# Patient Record
Sex: Female | Born: 1970 | Race: White | Hispanic: No | Marital: Single | Smoking: Never smoker
Health system: Southern US, Community
[De-identification: ages and names within clinical notes are randomized; demographics above are authoritative.]

## PROBLEM LIST (undated history)

## (undated) DIAGNOSIS — J449 Chronic obstructive pulmonary disease, unspecified: Secondary | ICD-10-CM

## (undated) DIAGNOSIS — C140 Malignant neoplasm of pharynx, unspecified: Secondary | ICD-10-CM

## (undated) DIAGNOSIS — R569 Unspecified convulsions: Secondary | ICD-10-CM

## (undated) DIAGNOSIS — M533 Sacrococcygeal disorders, not elsewhere classified: Secondary | ICD-10-CM

## (undated) DIAGNOSIS — Z8051 Family history of malignant neoplasm of kidney: Secondary | ICD-10-CM

## (undated) DIAGNOSIS — G8929 Other chronic pain: Secondary | ICD-10-CM

## (undated) DIAGNOSIS — R32 Unspecified urinary incontinence: Secondary | ICD-10-CM

## (undated) DIAGNOSIS — D509 Iron deficiency anemia, unspecified: Secondary | ICD-10-CM

## (undated) DIAGNOSIS — M545 Low back pain, unspecified: Secondary | ICD-10-CM

## (undated) DIAGNOSIS — O009 Unspecified ectopic pregnancy without intrauterine pregnancy: Secondary | ICD-10-CM

## (undated) DIAGNOSIS — K649 Unspecified hemorrhoids: Secondary | ICD-10-CM

## (undated) HISTORY — PX: KNEE SURGERY: SHX244

## (undated) HISTORY — PX: ECTOPIC PREGNANCY SURGERY: SHX613

## (undated) HISTORY — PX: BACK SURGERY: SHX140

---

## 2003-07-17 ENCOUNTER — Other Ambulatory Visit: Payer: Self-pay

## 2003-09-02 ENCOUNTER — Other Ambulatory Visit: Payer: Self-pay

## 2004-03-15 ENCOUNTER — Emergency Department: Payer: Self-pay | Admitting: Emergency Medicine

## 2005-08-27 ENCOUNTER — Emergency Department: Payer: Self-pay | Admitting: Emergency Medicine

## 2006-02-01 ENCOUNTER — Emergency Department: Payer: Self-pay | Admitting: Emergency Medicine

## 2006-04-18 ENCOUNTER — Inpatient Hospital Stay: Payer: Self-pay | Admitting: Internal Medicine

## 2006-12-18 ENCOUNTER — Ambulatory Visit: Payer: Self-pay | Admitting: Gastroenterology

## 2007-03-28 ENCOUNTER — Emergency Department: Payer: Self-pay | Admitting: Emergency Medicine

## 2008-02-20 ENCOUNTER — Ambulatory Visit: Payer: Self-pay | Admitting: Internal Medicine

## 2008-04-14 ENCOUNTER — Inpatient Hospital Stay: Payer: Self-pay | Admitting: Internal Medicine

## 2008-05-25 ENCOUNTER — Ambulatory Visit: Payer: Self-pay | Admitting: Family Medicine

## 2008-07-21 ENCOUNTER — Ambulatory Visit: Payer: Self-pay | Admitting: Internal Medicine

## 2008-09-19 ENCOUNTER — Emergency Department: Payer: Self-pay | Admitting: Emergency Medicine

## 2015-05-25 ENCOUNTER — Emergency Department: Payer: Medicare Other

## 2015-05-25 ENCOUNTER — Inpatient Hospital Stay
Admission: EM | Admit: 2015-05-25 | Discharge: 2015-05-26 | DRG: 101 | Disposition: A | Discharge status: Other Acute Inpatient Hospital | Payer: Medicare Other | Attending: Internal Medicine | Admitting: Internal Medicine

## 2015-05-25 DIAGNOSIS — S4990XA Unspecified injury of shoulder and upper arm, unspecified arm, initial encounter: Secondary | ICD-10-CM | POA: Diagnosis present

## 2015-05-25 DIAGNOSIS — R569 Unspecified convulsions: Secondary | ICD-10-CM

## 2015-05-25 DIAGNOSIS — Z85819 Personal history of malignant neoplasm of unspecified site of lip, oral cavity, and pharynx: Secondary | ICD-10-CM | POA: Diagnosis not present

## 2015-05-25 DIAGNOSIS — Z85528 Personal history of other malignant neoplasm of kidney: Secondary | ICD-10-CM

## 2015-05-25 DIAGNOSIS — G894 Chronic pain syndrome: Secondary | ICD-10-CM | POA: Diagnosis present

## 2015-05-25 DIAGNOSIS — W19XXXA Unspecified fall, initial encounter: Secondary | ICD-10-CM | POA: Diagnosis present

## 2015-05-25 DIAGNOSIS — G4089 Other seizures: Secondary | ICD-10-CM | POA: Diagnosis present

## 2015-05-25 DIAGNOSIS — Z9221 Personal history of antineoplastic chemotherapy: Secondary | ICD-10-CM

## 2015-05-25 DIAGNOSIS — Z91041 Radiographic dye allergy status: Secondary | ICD-10-CM | POA: Diagnosis not present

## 2015-05-25 DIAGNOSIS — Y92009 Unspecified place in unspecified non-institutional (private) residence as the place of occurrence of the external cause: Secondary | ICD-10-CM

## 2015-05-25 DIAGNOSIS — M549 Dorsalgia, unspecified: Secondary | ICD-10-CM | POA: Diagnosis present

## 2015-05-25 DIAGNOSIS — Z888 Allergy status to other drugs, medicaments and biological substances status: Secondary | ICD-10-CM | POA: Diagnosis not present

## 2015-05-25 DIAGNOSIS — M25511 Pain in right shoulder: Secondary | ICD-10-CM

## 2015-05-25 HISTORY — DX: Family history of malignant neoplasm of kidney: Z80.51

## 2015-05-25 HISTORY — DX: Low back pain, unspecified: M54.50

## 2015-05-25 HISTORY — DX: Other chronic pain: G89.29

## 2015-05-25 HISTORY — DX: Malignant neoplasm of pharynx, unspecified: C14.0

## 2015-05-25 HISTORY — DX: Unspecified convulsions: R56.9

## 2015-05-25 HISTORY — DX: Iron deficiency anemia, unspecified: D50.9

## 2015-05-25 HISTORY — DX: Low back pain: M54.5

## 2015-05-25 HISTORY — DX: Unspecified hemorrhoids: K64.9

## 2015-05-25 HISTORY — DX: Sacrococcygeal disorders, not elsewhere classified: M53.3

## 2015-05-25 HISTORY — DX: Unspecified urinary incontinence: R32

## 2015-05-25 HISTORY — DX: Unspecified ectopic pregnancy without intrauterine pregnancy: O00.90

## 2015-05-25 LAB — URINE DRUG SCREEN, QUALITATIVE (ARMC ONLY)
AMPHETAMINES, UR SCREEN: NOT DETECTED
BENZODIAZEPINE, UR SCRN: POSITIVE — AB
Barbiturates, Ur Screen: NOT DETECTED
Cannabinoid 50 Ng, Ur ~~LOC~~: POSITIVE — AB
Cocaine Metabolite,Ur ~~LOC~~: NOT DETECTED
MDMA (ECSTASY) UR SCREEN: NOT DETECTED
Methadone Scn, Ur: NOT DETECTED
Opiate, Ur Screen: POSITIVE — AB
PHENCYCLIDINE (PCP) UR S: NOT DETECTED
TRICYCLIC, UR SCREEN: POSITIVE — AB

## 2015-05-25 LAB — BASIC METABOLIC PANEL
ANION GAP: 12 (ref 5–15)
BUN: 7 mg/dL (ref 6–20)
CALCIUM: 8.9 mg/dL (ref 8.9–10.3)
CO2: 24 mmol/L (ref 22–32)
Chloride: 101 mmol/L (ref 101–111)
Creatinine, Ser: 0.65 mg/dL (ref 0.44–1.00)
GFR calc Af Amer: >60 mL/min (ref >60)
GLUCOSE: 99 mg/dL (ref 65–99)
Potassium: 4.1 mmol/L (ref 3.5–5.1)
SODIUM: 137 mmol/L (ref 135–145)

## 2015-05-25 LAB — URINALYSIS COMPLETE WITH MICROSCOPIC (ARMC ONLY)
Bilirubin Urine: NEGATIVE
Glucose, UA: NEGATIVE mg/dL
KETONES UR: NEGATIVE mg/dL
LEUKOCYTES UA: NEGATIVE
Nitrite: POSITIVE — AB
PH: 6 (ref 5.0–8.0)
PROTEIN: NEGATIVE mg/dL
RBC / HPF: NONE SEEN RBC/hpf (ref 0–5)
Specific Gravity, Urine: 1.004 — ABNORMAL LOW (ref 1.005–1.030)

## 2015-05-25 LAB — HEPATIC FUNCTION PANEL
ALT: 10 U/L — AB (ref 14–54)
AST: 17 U/L (ref 15–41)
Albumin: 4.4 g/dL (ref 3.5–5.0)
Alkaline Phosphatase: 38 U/L (ref 38–126)
BILIRUBIN INDIRECT: 0.5 mg/dL (ref 0.3–0.9)
Bilirubin, Direct: 0.1 mg/dL (ref 0.1–0.5)
TOTAL PROTEIN: 7.2 g/dL (ref 6.5–8.1)
Total Bilirubin: 0.6 mg/dL (ref 0.3–1.2)

## 2015-05-25 LAB — GLUCOSE, CAPILLARY: Glucose-Capillary: 93 mg/dL (ref 65–99)

## 2015-05-25 LAB — CBC
HCT: 41.2 % (ref 35.0–47.0)
HEMOGLOBIN: 13.3 g/dL (ref 12.0–16.0)
MCH: 27.7 pg (ref 26.0–34.0)
MCHC: 32.2 g/dL (ref 32.0–36.0)
MCV: 86 fL (ref 80.0–100.0)
Platelets: 279 10*3/uL (ref 150–440)
RBC: 4.8 MIL/uL (ref 3.80–5.20)
RDW: 13 % (ref 11.5–14.5)
WBC: 9.2 10*3/uL (ref 3.6–11.0)

## 2015-05-25 LAB — LIPASE, BLOOD: LIPASE: 18 U/L (ref 11–51)

## 2015-05-25 MED ORDER — ACETAMINOPHEN 325 MG PO TABS: 650.0000 mg | ORAL_TABLET | Freq: Four times a day (QID) | ORAL | Status: DC | PRN

## 2015-05-25 MED ORDER — MOMETASONE FURO-FORMOTEROL FUM 100-5 MCG/ACT IN AERO
2.0000 | INHALATION_SPRAY | Freq: Two times a day (BID) | RESPIRATORY_TRACT | Status: DC
  Administered 2015-05-25: 2 via RESPIRATORY_TRACT
  Filled 2015-05-25: qty 8.8

## 2015-05-25 MED ORDER — ENOXAPARIN SODIUM 40 MG/0.4ML ~~LOC~~ SOLN: 40.0000 mg | SUBCUTANEOUS | Status: DC

## 2015-05-25 MED ORDER — SODIUM CHLORIDE 0.9 % IJ SOLN
3.0000 mL | INTRAMUSCULAR | Status: DC | PRN
  Administered 2015-05-25: 3 mL via INTRAVENOUS
  Filled 2015-05-25: qty 10

## 2015-05-25 MED ORDER — POLYETHYLENE GLYCOL 3350 17 G PO PACK: 17.0000 g | PACK | Freq: Every day | ORAL | Status: DC | PRN

## 2015-05-25 MED ORDER — SODIUM CHLORIDE 0.9 % IV SOLN: 250.0000 mL | INTRAVENOUS | Status: DC | PRN

## 2015-05-25 MED ORDER — SODIUM CHLORIDE 0.9 % IV SOLN
1000.0000 mg | Freq: Once | INTRAVENOUS | Status: AC
  Administered 2015-05-25: 1000 mg via INTRAVENOUS
  Filled 2015-05-25: qty 10

## 2015-05-25 MED ORDER — ONDANSETRON HCL 4 MG PO TABS: 4.0000 mg | ORAL_TABLET | Freq: Four times a day (QID) | ORAL | Status: DC | PRN

## 2015-05-25 MED ORDER — ONDANSETRON HCL 4 MG/2ML IJ SOLN
4.0000 mg | Freq: Four times a day (QID) | INTRAMUSCULAR | Status: DC | PRN
  Administered 2015-05-25: 4 mg via INTRAVENOUS
  Filled 2015-05-25: qty 2

## 2015-05-25 MED ORDER — MORPHINE SULFATE ER 15 MG PO TBCR
15.0000 mg | EXTENDED_RELEASE_TABLET | Freq: Three times a day (TID) | ORAL | Status: DC
  Administered 2015-05-25 – 2015-05-26 (×3): 15 mg via ORAL
  Filled 2015-05-25 (×3): qty 1

## 2015-05-25 MED ORDER — GLYCOPYRROLATE 1 MG PO TABS
1.0000 mg | ORAL_TABLET | Freq: Every evening | ORAL | Status: DC
  Administered 2015-05-25: 1 mg via ORAL
  Filled 2015-05-25 (×2): qty 1

## 2015-05-25 MED ORDER — FERROUS SULFATE 325 (65 FE) MG PO TABS
325.0000 mg | ORAL_TABLET | Freq: Three times a day (TID) | ORAL | Status: DC
  Administered 2015-05-25 – 2015-05-26 (×2): 325 mg via ORAL
  Filled 2015-05-25 (×3): qty 1

## 2015-05-25 MED ORDER — LEVETIRACETAM 750 MG PO TABS
750.0000 mg | ORAL_TABLET | Freq: Two times a day (BID) | ORAL | Status: DC
  Filled 2015-05-25 (×2): qty 1

## 2015-05-25 MED ORDER — ONDANSETRON HCL 4 MG/2ML IJ SOLN
4.0000 mg | Freq: Once | INTRAMUSCULAR | Status: AC
  Administered 2015-05-25: 4 mg via INTRAVENOUS
  Filled 2015-05-25: qty 2

## 2015-05-25 MED ORDER — SODIUM CHLORIDE 0.9 % IJ SOLN: 3.0000 mL | Freq: Two times a day (BID) | INTRAMUSCULAR | Status: DC

## 2015-05-25 MED ORDER — DARIFENACIN HYDROBROMIDE ER 7.5 MG PO TB24
15.0000 mg | ORAL_TABLET | Freq: Every day | ORAL | Status: DC
  Administered 2015-05-25: 15 mg via ORAL
  Filled 2015-05-25 (×2): qty 1

## 2015-05-25 MED ORDER — GABAPENTIN 300 MG PO CAPS
900.0000 mg | ORAL_CAPSULE | Freq: Every day | ORAL | Status: DC
  Administered 2015-05-26: 900 mg via ORAL
  Filled 2015-05-25: qty 3

## 2015-05-25 MED ORDER — ALBUTEROL SULFATE HFA 108 (90 BASE) MCG/ACT IN AERS: 2.0000 | INHALATION_SPRAY | Freq: Four times a day (QID) | RESPIRATORY_TRACT | Status: DC | PRN

## 2015-05-25 MED ORDER — OXYCODONE HCL 5 MG PO TABS
5.0000 mg | ORAL_TABLET | Freq: Three times a day (TID) | ORAL | Status: DC | PRN
  Administered 2015-05-25 – 2015-05-26 (×2): 5 mg via ORAL
  Filled 2015-05-25 (×2): qty 1

## 2015-05-25 MED ORDER — BISACODYL 10 MG RE SUPP: 10.0000 mg | Freq: Every day | RECTAL | Status: DC | PRN

## 2015-05-25 MED ORDER — ALBUTEROL SULFATE (2.5 MG/3ML) 0.083% IN NEBU: 2.5000 mg | INHALATION_SOLUTION | RESPIRATORY_TRACT | Status: DC | PRN

## 2015-05-25 MED ORDER — LORAZEPAM 2 MG/ML IJ SOLN
INTRAMUSCULAR | Status: AC
  Administered 2015-05-25: 1 mg via INTRAVENOUS
  Filled 2015-05-25: qty 1

## 2015-05-25 MED ORDER — GADOBENATE DIMEGLUMINE 529 MG/ML IV SOLN: 15.0000 mL | Freq: Once | INTRAVENOUS | Status: DC | PRN

## 2015-05-25 MED ORDER — SODIUM CHLORIDE 0.9 % IV BOLUS (SEPSIS)
1000.0000 mL | Freq: Once | INTRAVENOUS | Status: AC
  Administered 2015-05-25: 1000 mL via INTRAVENOUS

## 2015-05-25 MED ORDER — CYCLOBENZAPRINE HCL 10 MG PO TABS: 10.0000 mg | ORAL_TABLET | Freq: Three times a day (TID) | ORAL | Status: DC | PRN

## 2015-05-25 MED ORDER — DOCUSATE SODIUM 100 MG PO CAPS
100.0000 mg | ORAL_CAPSULE | Freq: Two times a day (BID) | ORAL | Status: DC
  Administered 2015-05-25: 100 mg via ORAL
  Filled 2015-05-25 (×2): qty 1

## 2015-05-25 MED ORDER — GABAPENTIN 600 MG PO TABS
900.0000 mg | ORAL_TABLET | Freq: Every morning | ORAL | Status: DC
  Filled 2015-05-25: qty 2

## 2015-05-25 MED ORDER — LORAZEPAM 2 MG/ML IJ SOLN
1.0000 mg | Freq: Once | INTRAMUSCULAR | Status: AC
  Administered 2015-05-25: 1 mg via INTRAVENOUS
  Filled 2015-05-25: qty 1

## 2015-05-25 MED ORDER — ACETAMINOPHEN 650 MG RE SUPP: 650.0000 mg | Freq: Four times a day (QID) | RECTAL | Status: DC | PRN

## 2015-05-25 MED ORDER — GABAPENTIN 300 MG PO CAPS
1200.0000 mg | ORAL_CAPSULE | Freq: Every day | ORAL | Status: DC
  Administered 2015-05-25: 1200 mg via ORAL
  Filled 2015-05-25: qty 3

## 2015-05-25 MED ORDER — ALBUTEROL SULFATE (2.5 MG/3ML) 0.083% IN NEBU: 2.5000 mg | INHALATION_SOLUTION | Freq: Four times a day (QID) | RESPIRATORY_TRACT | Status: DC | PRN

## 2015-05-25 MED ORDER — MORPHINE SULFATE (PF) 4 MG/ML IV SOLN
4.0000 mg | Freq: Once | INTRAVENOUS | Status: AC
  Administered 2015-05-25: 4 mg via INTRAVENOUS
  Filled 2015-05-25: qty 1

## 2015-05-25 MED ORDER — LORAZEPAM 2 MG/ML IJ SOLN
1.0000 mg | Freq: Once | INTRAMUSCULAR | Status: AC
  Administered 2015-05-25: 1 mg via INTRAVENOUS

## 2015-05-25 MED ORDER — NORTRIPTYLINE HCL 25 MG PO CAPS
75.0000 mg | ORAL_CAPSULE | Freq: Every day | ORAL | Status: DC
  Administered 2015-05-25: 75 mg via ORAL
  Filled 2015-05-25 (×2): qty 3

## 2015-05-25 MED ORDER — SODIUM CHLORIDE 0.9 % IV SOLN
INTRAVENOUS | Status: DC
  Administered 2015-05-25 – 2015-05-26 (×2): via INTRAVENOUS

## 2015-05-25 MED ORDER — MONTELUKAST SODIUM 10 MG PO TABS
10.0000 mg | ORAL_TABLET | Freq: Every day | ORAL | Status: DC
  Administered 2015-05-25: 10 mg via ORAL
  Filled 2015-05-25: qty 1

## 2015-05-25 MED ORDER — TIOTROPIUM BROMIDE MONOHYDRATE 18 MCG IN CAPS
18.0000 ug | ORAL_CAPSULE | Freq: Every day | RESPIRATORY_TRACT | Status: DC
  Filled 2015-05-25: qty 5

## 2015-05-25 MED ORDER — DIPHENOXYLATE-ATROPINE 2.5-0.025 MG PO TABS: 1.0000 | ORAL_TABLET | Freq: Four times a day (QID) | ORAL | Status: DC | PRN

## 2015-05-25 MED ORDER — MORPHINE SULFATE (PF) 2 MG/ML IV SOLN
2.0000 mg | INTRAVENOUS | Status: DC | PRN
  Administered 2015-05-26 (×3): 2 mg via INTRAVENOUS
  Filled 2015-05-25 (×3): qty 1

## 2015-05-25 MED ORDER — LORAZEPAM 2 MG/ML IJ SOLN
2.0000 mg | INTRAMUSCULAR | Status: DC | PRN
  Administered 2015-05-26 (×2): 2 mg via INTRAVENOUS
  Administered 2015-05-26 (×2): 1 mg via INTRAVENOUS
  Filled 2015-05-25 (×4): qty 1

## 2015-05-25 MED ORDER — MIRTAZAPINE 15 MG PO TABS
15.0000 mg | ORAL_TABLET | Freq: Every evening | ORAL | Status: DC | PRN
  Filled 2015-05-25: qty 1

## 2015-05-25 NOTE — ED Notes (Signed)
MD at bedside. 

## 2015-05-25 NOTE — ED Notes (Signed)
Pt comes back from MRI due to seizing on the stretcher at CT . Pt tearful, alert but confused at this time. Dr. Edd Fabian at bedside

## 2015-05-25 NOTE — ED Notes (Addendum)
Manual radial pulse found at 100 by 2 RNs. MD made aware

## 2015-05-25 NOTE — ED Provider Notes (Signed)
[facility] Emergency Department Provider Note  ____________________________________________  Time seen: On EMS arrival  I have reviewed the triage vital signs and the nursing notes.   HISTORY  Chief Complaint Seizures and Shoulder Injury    HPI Jill Santiago is a 44 y.o. female with history of kidney cancer and "a tumor in my neck" not currently under any active treatment or followed by an oncologist "because they are trying to find me another one", seizures presents for evaluation of 2 seizures today, sudden onset, intermittent, now resolved. Patient reports that she was washing dishes when she had a generalized tonic-clonic seizure that was witnessed by her husband. She called 911 because she was having terrible right shoulder pain after the events. In route, paramedics report that she had another generalized tonic-clonic seizure. She received Versed for this. Patient reports that she was incontinent of urineduring the most recent seizure. She has some mild right-sided chest pain since the fall that is worse with palpation and movement of the right shoulder. She has chronic issues with nausea and vomiting but no fevers, no shortness of breath. She denies any pain or burning with urination. She reports that she usually has seizures once a month or once every other month. She has not had a seizure in 2 months.   Past Medical History  Diagnosis Date  . Seizures ([place])   . FH: kidney cancer   . Throat cancer ([place])     There are no active problems to display for this patient.   History reviewed. No pertinent past surgical history.  No current outpatient prescriptions on file.  Allergies Azithromycin and Contrast media  No family history on file.  Social History Social History  Substance Use Topics  . Smoking status: Never Smoker   . Smokeless tobacco: None  . Alcohol Use: No    Review of Systems Constitutional: No fever/chills Eyes: No visual  changes. ENT: No sore throat. Cardiovascular: + right chest pain. Respiratory: Denies shortness of breath. Gastrointestinal: No abdominal pain.  No nausea, no vomiting.  No diarrhea.  No constipation. Genitourinary: Negative for dysuria. Musculoskeletal: Negative for back pain. Skin: Negative for rash. Neurological: Negative for headaches, focal weakness or numbness.  10-point ROS otherwise negative.  ____________________________________________   PHYSICAL EXAM:  VITAL SIGNS: ED Triage Vitals  Enc Vitals Group     BP 05/25/15 1435 135/107 mmHg     Pulse Rate 05/25/15 1435 107     Resp 05/25/15 1435 12     Temp 05/25/15 1435 98.3 F (36.8 C)     Temp Source 05/25/15 1435 Oral     SpO2 05/25/15 1435 100 %     Weight 05/25/15 1435 130 lb (58.968 kg)     Height 05/25/15 1435 5' 5"  (1.651 m)     Head Cir --      Peak Flow --      Pain Score 05/25/15 1436 6     Pain Loc --      Pain Edu? --      Excl. in [place]? --     Constitutional: Alert and oriented. Nontoxic appearing and in no acute distress. Eyes: Conjunctivae are normal. PERRL. EOMI. Head: Atraumatic. Nose: No congestion/rhinnorhea. Mouth/Throat: Mucous membranes are moist.  Oropharynx non-erythematous. Neck: No stridor.  No midline c-spine tenderness to palpation. Cardiovascular: mildly tachycardic rate, regular rhythm. Grossly normal heart sounds.  Good peripheral circulation. Respiratory: Normal respiratory effort.  No retractions. Lungs CTAB. Gastrointestinal: Soft and nontender. No distention. No  CVA tenderness. Genitourinary: deferred Musculoskeletal: No lower extremity tenderness nor edema.  No joint effusions. There is a palpation throughout the right shoulder but the patient does have full though painful range of motion. 2+ right radial pulse, radial median and ulnar nerve intact on the right. Mild tenderness to palpation associated with the right clavicle and the ribs just under the right axilla. Neurologic:   Normal speech and language. 4+ out of 5 strength in the right upper extremity, 4+ out of 5 strength in the right lower extremity. 5 out of 5 strength in the left upper and left lower extremity. Sensations lightly decreased in the right lower extremity and the right upper extremity but otherwise intact to light touch. Skin:  Skin is warm, dry and intact. No rash noted. Psychiatric: Mood and affect are normal. Speech and behavior are normal.  ____________________________________________   LABS (all labs ordered are listed, but only abnormal results are displayed)  Labs Reviewed  HEPATIC FUNCTION PANEL - Abnormal; Notable for the following:    ALT 10 (*)    All other components within normal limits  GLUCOSE, CAPILLARY  BASIC METABOLIC PANEL  CBC  LIPASE, BLOOD  URINALYSIS COMPLETEWITH MICROSCOPIC ([facility] ONLY)  CBG MONITORING, ED   ____________________________________________  EKG  ED ECG REPORT I, Joanne Gavel, the attending physician, personally viewed and interpreted this ECG.   Date: 05/25/2015  EKG Time: 14:43  Rate: 96  Rhythm: sinus tachycardia  Axis: normal  Intervals:none  ST&T Change: No acute ST elevation.  ____________________________________________  RADIOLOGY  CXR IMPRESSION: Mild chronic bronchitic changes without infiltrate or evidence of acute injury.   Right shoulder xray IMPRESSION: No displaced fracture or subluxation. High riding humeral head suspicious for rotator cuff injury  Ct head IMPRESSION: No acute intracranial abnormalities.  If patient has persistent or recurrent seizures, recommend followup MR imaging of the brain for further assessment.   MRI C-spine without contrast IMPRESSION: 1. Multilevel spondylosis of the cervical spine is worse right than left. 2. Mild right central and foraminal stenosis at C3-4. 3. Moderate right and mild left foraminal narrowing at C4-5 and C5-6. 4. Mild right foraminal narrowing at C6-7.  MRI  brain without contrast IMPRESSION: Negative MRI of the brain.  ____________________________________________   PROCEDURES  Procedure(s) performed: None  Critical Care performed: No  ____________________________________________   INITIAL IMPRESSION / ASSESSMENT AND PLAN / ED COURSE  Pertinent labs & imaging results that were available during my care of the patient were reviewed by me and considered in my medical decision making (see chart for details).  Jill Santiago is a 44 y.o. female with history of kidney cancer and "a tumor in my neck" not currently under any active treatment or followed by an oncologist "because they are trying to findme another one", seizures presents for evaluation of 2 seizures today. On arrival to the emergency department she is awake, alert, oriented 4. She has some weakness in the right arm and right leg which may be post ictal/Todd's paralysis or the exam in the right upper extremity may be limited to pain due to the pain in her shoulder. she is neurovascularly intact in the right arm and has range of motion at the right shoulder and I doubt acute fracture or dislocation but will obtain x-ray. Plan for CT head, chest x-ray, screening labs, we'll observe in the emergency department. She will likely require advanced imaging including MRI of the brain and C-spine given history of malignancy.  ----------------------------------------- 6:16 PM on 05/25/2015 -----------------------------------------  Patient was brought back from MRI prematurely due to generalized tonic-clonic activity that was noted by the MRI tech while she was in the scanner - contrasted exam unable to be performed. On arrival back to room 14 in the ER, she is mildly confused but able to follow commands and moves all extremities. On chart review via care everywhere, she does not appear to carry any active cancer diagnosis [facility] charts reveiwed and my suspicion for an intracerebral  malignancy/metastasis) is lower at this time. Additionally, she is only prescribed gabapentin for her seizures - a medication typically use for treatment of pain and not as an antiepileptic. I discussed the case with Dr. Irish Elders, on-call for neurology who is in agreement with admission for EEG, monitoring, will give Keppra. Case discussed with the hospitalist, Dr. Darvin Neighbours, for admission at this time. ____________________________________________   FINAL CLINICAL IMPRESSION(S) / ED DIAGNOSES  Final diagnoses:  Seizure ([place])  Acute shoulder pain, right      Joanne Gavel, MD 05/25/15 2146

## 2015-05-25 NOTE — ED Notes (Signed)
Lab called for add on of lipase and hepatic funct

## 2015-05-25 NOTE — H&P (Signed)
[place] [facility] [place] NAME: Jill Santiago    MR#:  [id number]  DATE OF BIRTH:  1970/09/24  DATE OF ADMISSION:  05/25/2015  PRIMARY CARE PHYSICIAN: [place]   REQUESTING/REFERRING PHYSICIAN: Dr. Edd Fabian  CHIEF COMPLAINT:   Chief Complaint  Patient presents with  . Seizures  . Shoulder Injury    HISTORY OF PRESENT ILLNESS:  Jill Santiago  is a 44 y.o. female with a known history of chronic back pain, anxiety, seizures on Neurontin presents to the emergency room with 3 episodes of seizures. Patient also had an episode of seizure while finishing her MRI of the brain. She has long history of seizures and was on phenobarbital in the past with well-controlled seizures and was stopped 5 years back. Since then she has been on Neurontin for both chronic pain and seizures and has one episode of generalized tonic-clonic seizure every 6 months. Her last seizure was 3 months prior. Today she had 4 episodes of generalized tonic-clonic seizures with postictal state and incontinence. Patient has been extremely anxious and tearful. Her only complaint at this point is new pain and right shoulder x-ray showing possible rotator cuff tear.  Patient mentions that she has kidney cancer for which she took chemotherapy and stopped it 1 year back. This was at [facility]. But on reviewing her records couldn't find any oncology visits.  PAST MEDICAL HISTORY:   Past Medical History  Diagnosis Date  . Seizures ([place])   . FH: kidney cancer   . Throat cancer ([place])   . Iron deficiency anemia   . Hemorrhoids   . Chronic low back pain   . Urinary incontinence   . Sacroiliac joint dysfunction   . Ectopic pregnancy     PAST SURGICAL HISTORY:  History reviewed. No pertinent past surgical history.  SOCIAL HISTORY:   Social History  Substance Use Topics  . Smoking status: Never Smoker   . Smokeless tobacco: Not on file  . Alcohol Use: No    FAMILY  HISTORY:   No history of seizures DRUG ALLERGIES:   Allergies  Allergen Reactions  . Azithromycin   . Contrast Media [Iodinated Diagnostic Agents]     REVIEW OF SYSTEMS:   Review of Systems  Constitutional: Positive for malaise/fatigue. Negative for fever, chills and weight loss.  HENT: Negative for hearing loss and nosebleeds.   Eyes: Negative for blurred vision, double vision and pain.  Respiratory: Negative for cough, hemoptysis, sputum production, shortness of breath and wheezing.   Cardiovascular: Negative for chest pain, palpitations, orthopnea and leg swelling.  Gastrointestinal: Positive for nausea. Negative for vomiting, abdominal pain, diarrhea and constipation.  Genitourinary: Positive for urgency. Negative for dysuria and hematuria.  Musculoskeletal: Positive for back pain. Negative for myalgias and falls.  Skin: Negative for rash.  Neurological: Positive for tremors and seizures. Negative for dizziness, sensory change, speech change, focal weakness and headaches.  Endo/Heme/Allergies: Does not bruise/bleed easily.  Psychiatric/Behavioral: Positive for depression. Negative for memory loss. The patient is nervous/anxious.     MEDICATIONS AT HOME:   Prior to Admission medications   Not on File      VITAL SIGNS:  Blood pressure 132/90, pulse 105, temperature 98.3 F (36.8 C), temperature source Oral, resp. rate 11, height 5' 5"  (1.651 m), weight 58.968 kg (130 lb), SpO2 100 %.  PHYSICAL EXAMINATION:  Physical Exam  GENERAL:  44 y.o.-year-old patient lying in the bed with no acute distress. Tearful and anxious  EYES: Pupils equal, round, reactive to light and accommodation. No scleral icterus. Extraocular muscles intact.  HEENT: Head atraumatic, normocephalic. Oropharynx and nasopharynx clear. No oropharyngeal erythema, moist oral mucosa  NECK:  Supple, no jugular venous distention. No thyroid enlargement, no tenderness.  LUNGS: Normal breath sounds bilaterally,  no wheezing, rales, rhonchi. No use of accessory muscles of respiration.  CARDIOVASCULAR: S1, S2 normal. No murmurs, rubs, or gallops.  ABDOMEN: Soft, nontender, nondistended. Bowel sounds present. No organomegaly or mass.  EXTREMITIES: No pedal edema, cyanosis, or clubbing. + 2 pedal & radial pulses b/l.  Tenderness right shoulder NEUROLOGIC: Cranial nerves II through XII are intact. No focal Motor or sensory deficits appreciated b/l. Limited range of motion at right shoulder due to injury. Right arm tremor PSYCHIATRIC: The patient is alert and oriented x 3. Anxious SKIN: No obvious rash, lesion, or ulcer.   LABORATORY PANEL:   CBC  Recent Labs Lab 05/25/15 1439  WBC 9.2  HGB 13.3  HCT 41.2  PLT 279   ------------------------------------------------------------------------------------------------------------------  Chemistries   Recent Labs Lab 05/25/15 1439  NA 137  K 4.1  CL 101  CO2 24  GLUCOSE 99  BUN 7  CREATININE 0.65  CALCIUM 8.9  AST 17  ALT 10*  ALKPHOS 38  BILITOT 0.6   ------------------------------------------------------------------------------------------------------------------  Cardiac Enzymes No results for input(s): TROPONINI in the last 168 hours. ------------------------------------------------------------------------------------------------------------------  RADIOLOGY:  Dg Chest 2 View  05/25/2015  CLINICAL DATA:  Seizure at home, fell onto RIGHT shoulder, RIGHT shoulder pain EXAM: CHEST  2 VIEW COMPARISON:  07/21/2008 FINDINGS: Normal heart size, mediastinal contours, and pulmonary vascularity. Mild chronic bronchitic changes. Lungs otherwise clear. No pleural effusion or pneumothorax. No acute bony abnormalities. IMPRESSION: Mild chronic bronchitic changes without infiltrate or evidence of acute injury. Electronically Signed   By: Lavonia Dana M.D.   On: 05/25/2015 15:44   Dg Shoulder Right  05/25/2015  CLINICAL DATA:  Seizure, right  shoulder pain EXAM: RIGHT SHOULDER - 2+ VIEW COMPARISON:  None. FINDINGS: Three views of the right shoulder submitted. No acute fracture or subluxation. There is high riding humeral head suspicious for rotator cuff injury. IMPRESSION: No displaced fracture or subluxation. High riding humeral head suspicious for rotator cuff injury Electronically Signed   By: Lahoma Crocker M.D.   On: 05/25/2015 15:41   Ct Head Wo Contrast  05/25/2015  CLINICAL DATA:  Seizure at home, RIGHT shoulder pain/injury, history of seizure disorder, throat cancer EXAM: CT HEAD WITHOUT CONTRAST TECHNIQUE: Contiguous axial images were obtained from the base of the skull through the vertex without intravenous contrast. COMPARISON:  03/28/2007 CT orbits FINDINGS: Normal ventricular morphology. No midline shift or mass effect. Normal appearance of brain parenchyma. No intracranial hemorrhage, mass lesion, or acute infarction. Minimal scattered mucosal thickening of the ethmoid air cells. Visualized paranasal sinuses and mastoid air cells otherwise clear. Bones unremarkable. IMPRESSION: No acute intracranial abnormalities. If patient has persistent or recurrent seizures, recommend followup MR imaging of the brain for further assessment. Electronically Signed   By: Lavonia Dana M.D.   On: 05/25/2015 15:31   Mr Brain Wo Contrast  05/25/2015  CLINICAL DATA:  Seizure at home and in the ambulance today. Initial encounter. EXAM: MRI HEAD WITHOUT CONTRAST TECHNIQUE: Multiplanar, multiecho pulse sequences of the brain and surrounding structures were obtained without intravenous contrast. COMPARISON:  CT head without contrast 05/25/2015 FINDINGS: No acute infarct, hemorrhage, or mass lesion is present. The ventricles are of normal size. No significant extraaxial fluid collection  is present. Dedicated imaging of the temporal lobes demonstrate symmetric size and signal of the hippocampal structures. The internal auditory canals are within normal limits  bilaterally. Flow is present in the major intracranial arteries. The globes and orbits are intact. Mild mucosal thickening is scattered throughout the ethmoid air cells. The remaining paranasal sinuses are clear. Skullbase is within normal limits. Midline structures are unremarkable. IMPRESSION: Negative MRI of the brain. Electronically Signed   By: San Morelle M.D.   On: 05/25/2015 17:28   Mr Cervical Spine Wo Contrast  05/25/2015  CLINICAL DATA:  Seizure today. Right shoulder pain and injury. Right hand tremors. EXAM: MRI CERVICAL SPINE WITHOUT CONTRAST TECHNIQUE: Multiplanar, multisequence MR imaging of the cervical spine was performed. No intravenous contrast was administered. COMPARISON:  Cervical spine CT 09/19/2008 FINDINGS: Normal signal is present in the cervical and upper thoracic spinal cord to the lowest imaged level, T2-3. Marrow signal, vertebral body heights, and alignment are normal. The craniocervical junction is within normal limits. The visualized intracranial contents are normal. C2-3:  Negative. C3-4: A rightward disc osteophyte complex partially effaces the ventral CSF. Uncovertebral spurring contributes to mild right foraminal narrowing. C4-5: A broad-based disc osteophyte complex is present. Uncovertebral and facet disease is worse on the right. This contributes to moderate right and mild left foraminal narrowing. C5-6: A broad-based disc osteophyte complex is present. Facet hypertrophy is worse on the right. Facet hypertrophy and uncovertebral spurring is worse on the right. Moderate right and mild left foraminal stenosis is present. C6-7: Asymmetric rightward uncovertebral spurring contributes to mild right foraminal stenosis. C7-T1:  Negative. IMPRESSION: 1. Multilevel spondylosis of the cervical spine is worse right than left. 2. Mild right central and foraminal stenosis at C3-4. 3. Moderate right and mild left foraminal narrowing at C4-5 and C5-6. 4. Mild right foraminal  narrowing at C6-7. Electronically Signed   By: San Morelle M.D.   On: 05/25/2015 17:33     IMPRESSION AND PLAN:   * Seizures, recurrent History of generalized tonic-clonic seizures. Patient has been overall well controlled on Neurontin. Although she has had seizure episodes every 6 months as per the patient. No recent change in medications. MRI of the brain without contrast was normal today. Keppra IV 1 dose given. Will start oral from tomorrow morning. Add Ativan when necessary. For seizures. Seizure precautions. Consult neurology.  * Rotator cuff injury X-ray shows acute rotator cuff injury. Likely due to seizure. We will consult orthopedics.  * Chronic pain syndrome Continue home pain medications and add IV when necessary medications.  * DVT prophylaxis Lovenox   All the records are reviewed and case discussed with ED provider. Management plans discussed with the patient, family and they are in agreement.  CODE STATUS: FULL  TOTAL TIME TAKING CARE OF THIS PATIENT: 45 minutes.    Hillary Bow R M.D on 05/25/2015 at 6:28 PM  Between 7am to 6pm - Pager - [phone]  After 6pm go to [email or web address] - password [email or web address] [facility]  Office  [phone]  CC: Primary care physician; [place] [facility]     Note: This dictation was prepared with Dragon dictation along with smaller phrase technology. Any transcriptional errors that result from this process are unintentional.

## 2015-05-25 NOTE — ED Notes (Signed)
Patient transported to CT 

## 2015-05-25 NOTE — ED Notes (Signed)
meds held due to pt transported to CT

## 2015-05-25 NOTE — ED Notes (Signed)
Patient transported to MRI 

## 2015-05-25 NOTE — ED Notes (Signed)
Lab called for add on of urine drug

## 2015-05-25 NOTE — ED Notes (Signed)
Pt c/o loss of bladder. Pt cleaned and wants to try bed pan for urine
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
## 2015-05-25 NOTE — ED Notes (Signed)
Sprite and crackers given at this time

## 2015-05-25 NOTE — ED Notes (Signed)
Pt arrives via [facility] with initial cc of right shoulder pain/injury after seizure at home. Pt en route with EMS to hospital, pt began to have another seizure. Pt given 82m versed en route IV with EMS. Pt not actively seizing on arrival. Pt alert and oriented X4. Pt has tremor to right hand, states that it is baseline. Pt hx of seizure, takes Neurontin. Pt did not have aura with either seizure.

## 2015-05-26 ENCOUNTER — Inpatient Hospital Stay (HOSPITAL_COMMUNITY): Payer: Medicare Other

## 2015-05-26 ENCOUNTER — Inpatient Hospital Stay (HOSPITAL_COMMUNITY)
Admission: AD | Admit: 2015-05-26 | Discharge: 2015-06-01 | DRG: 101 | Disposition: A | Discharge status: Skilled Nursing Facility | Payer: Medicare Other | Source: Other Acute Inpatient Hospital | Attending: Internal Medicine | Admitting: Internal Medicine

## 2015-05-26 ENCOUNTER — Encounter (HOSPITAL_COMMUNITY): Payer: Self-pay | Admitting: Internal Medicine

## 2015-05-26 DIAGNOSIS — T402X5A Adverse effect of other opioids, initial encounter: Secondary | ICD-10-CM | POA: Diagnosis not present

## 2015-05-26 DIAGNOSIS — Z85819 Personal history of malignant neoplasm of unspecified site of lip, oral cavity, and pharynx: Secondary | ICD-10-CM

## 2015-05-26 DIAGNOSIS — Z7951 Long term (current) use of inhaled steroids: Secondary | ICD-10-CM | POA: Diagnosis not present

## 2015-05-26 DIAGNOSIS — F4310 Post-traumatic stress disorder, unspecified: Secondary | ICD-10-CM | POA: Diagnosis present

## 2015-05-26 DIAGNOSIS — W19XXXA Unspecified fall, initial encounter: Secondary | ICD-10-CM

## 2015-05-26 DIAGNOSIS — N390 Urinary tract infection, site not specified: Secondary | ICD-10-CM | POA: Diagnosis present

## 2015-05-26 DIAGNOSIS — X58XXXA Exposure to other specified factors, initial encounter: Secondary | ICD-10-CM | POA: Diagnosis present

## 2015-05-26 DIAGNOSIS — J449 Chronic obstructive pulmonary disease, unspecified: Secondary | ICD-10-CM | POA: Diagnosis present

## 2015-05-26 DIAGNOSIS — M25551 Pain in right hip: Secondary | ICD-10-CM

## 2015-05-26 DIAGNOSIS — G894 Chronic pain syndrome: Secondary | ICD-10-CM | POA: Diagnosis present

## 2015-05-26 DIAGNOSIS — F54 Psychological and behavioral factors associated with disorders or diseases classified elsewhere: Secondary | ICD-10-CM | POA: Diagnosis present

## 2015-05-26 DIAGNOSIS — G4089 Other seizures: Principal | ICD-10-CM | POA: Diagnosis present

## 2015-05-26 DIAGNOSIS — R569 Unspecified convulsions: Secondary | ICD-10-CM

## 2015-05-26 DIAGNOSIS — R338 Other retention of urine: Secondary | ICD-10-CM | POA: Diagnosis not present

## 2015-05-26 DIAGNOSIS — R339 Retention of urine, unspecified: Secondary | ICD-10-CM | POA: Diagnosis present

## 2015-05-26 DIAGNOSIS — K5903 Drug induced constipation: Secondary | ICD-10-CM | POA: Diagnosis not present

## 2015-05-26 DIAGNOSIS — Z85528 Personal history of other malignant neoplasm of kidney: Secondary | ICD-10-CM

## 2015-05-26 DIAGNOSIS — K5900 Constipation, unspecified: Secondary | ICD-10-CM

## 2015-05-26 DIAGNOSIS — N319 Neuromuscular dysfunction of bladder, unspecified: Secondary | ICD-10-CM

## 2015-05-26 DIAGNOSIS — F1210 Cannabis abuse, uncomplicated: Secondary | ICD-10-CM | POA: Diagnosis present

## 2015-05-26 DIAGNOSIS — D509 Iron deficiency anemia, unspecified: Secondary | ICD-10-CM | POA: Diagnosis present

## 2015-05-26 DIAGNOSIS — B9620 Unspecified Escherichia coli [E. coli] as the cause of diseases classified elsewhere: Secondary | ICD-10-CM | POA: Diagnosis present

## 2015-05-26 DIAGNOSIS — Z79891 Long term (current) use of opiate analgesic: Secondary | ICD-10-CM

## 2015-05-26 DIAGNOSIS — S46011A Strain of muscle(s) and tendon(s) of the rotator cuff of right shoulder, initial encounter: Secondary | ICD-10-CM | POA: Diagnosis present

## 2015-05-26 DIAGNOSIS — R52 Pain, unspecified: Secondary | ICD-10-CM

## 2015-05-26 DIAGNOSIS — Z825 Family history of asthma and other chronic lower respiratory diseases: Secondary | ICD-10-CM | POA: Diagnosis not present

## 2015-05-26 DIAGNOSIS — R29898 Other symptoms and signs involving the musculoskeletal system: Secondary | ICD-10-CM

## 2015-05-26 HISTORY — DX: Chronic obstructive pulmonary disease, unspecified: J44.9

## 2015-05-26 LAB — GLUCOSE, CAPILLARY
GLUCOSE-CAPILLARY: 85 mg/dL (ref 65–99)
GLUCOSE-CAPILLARY: 83 mg/dL (ref 65–99)
Glucose-Capillary: 104 mg/dL — ABNORMAL HIGH (ref 65–99)

## 2015-05-26 LAB — MRSA PCR SCREENING
MRSA BY PCR: NEGATIVE
MRSA BY PCR: NEGATIVE

## 2015-05-26 MED ORDER — DARIFENACIN HYDROBROMIDE ER 7.5 MG PO TB24
7.5000 mg | ORAL_TABLET | Freq: Every day | ORAL | Status: DC
  Administered 2015-05-27 – 2015-05-29 (×3): 7.5 mg via ORAL
  Filled 2015-05-26 (×4): qty 1

## 2015-05-26 MED ORDER — OXYCODONE HCL 5 MG PO TABS
5.0000 mg | ORAL_TABLET | Freq: Three times a day (TID) | ORAL | Status: DC | PRN
  Administered 2015-05-27: 5 mg via ORAL
  Filled 2015-05-26: qty 1

## 2015-05-26 MED ORDER — LORAZEPAM 2 MG/ML IJ SOLN
1.0000 mg | INTRAMUSCULAR | Status: DC | PRN
  Administered 2015-05-27 – 2015-05-28 (×3): 1 mg via INTRAVENOUS
  Filled 2015-05-26 (×6): qty 1

## 2015-05-26 MED ORDER — ONDANSETRON HCL 4 MG PO TABS
4.0000 mg | ORAL_TABLET | Freq: Four times a day (QID) | ORAL | Status: DC | PRN
  Administered 2015-05-28 – 2015-06-01 (×8): 4 mg via ORAL
  Filled 2015-05-26 (×8): qty 1

## 2015-05-26 MED ORDER — GABAPENTIN 600 MG PO TABS
1200.0000 mg | ORAL_TABLET | Freq: Every day | ORAL | Status: DC
  Administered 2015-05-26 – 2015-05-31 (×6): 1200 mg via ORAL
  Filled 2015-05-26 (×7): qty 2

## 2015-05-26 MED ORDER — NORTRIPTYLINE HCL 25 MG PO CAPS
75.0000 mg | ORAL_CAPSULE | Freq: Every day | ORAL | Status: DC
  Administered 2015-05-26 – 2015-05-31 (×6): 75 mg via ORAL
  Filled 2015-05-26 (×7): qty 3

## 2015-05-26 MED ORDER — TIOTROPIUM BROMIDE MONOHYDRATE 18 MCG IN CAPS
18.0000 ug | ORAL_CAPSULE | Freq: Every day | RESPIRATORY_TRACT | Status: DC
  Administered 2015-05-27 – 2015-06-01 (×6): 18 ug via RESPIRATORY_TRACT
  Filled 2015-05-26: qty 5

## 2015-05-26 MED ORDER — ALBUTEROL SULFATE (2.5 MG/3ML) 0.083% IN NEBU: 2.5000 mg | INHALATION_SOLUTION | Freq: Four times a day (QID) | RESPIRATORY_TRACT | Status: DC | PRN

## 2015-05-26 MED ORDER — ACETAMINOPHEN 650 MG RE SUPP: 650.0000 mg | Freq: Four times a day (QID) | RECTAL | Status: DC | PRN

## 2015-05-26 MED ORDER — MOMETASONE FURO-FORMOTEROL FUM 100-5 MCG/ACT IN AERO
2.0000 | INHALATION_SPRAY | Freq: Two times a day (BID) | RESPIRATORY_TRACT | Status: DC
  Filled 2015-05-26: qty 8.8

## 2015-05-26 MED ORDER — ONDANSETRON HCL 4 MG/2ML IJ SOLN
4.0000 mg | Freq: Four times a day (QID) | INTRAMUSCULAR | Status: DC | PRN
Start: 2015-05-26 — End: 2015-05-28
  Administered 2015-05-27: 4 mg via INTRAVENOUS
  Filled 2015-05-26: qty 2

## 2015-05-26 MED ORDER — MORPHINE SULFATE (PF) 2 MG/ML IV SOLN
1.0000 mg | INTRAVENOUS | Status: DC | PRN
  Administered 2015-05-27 (×3): 1 mg via INTRAVENOUS
  Filled 2015-05-26 (×3): qty 1

## 2015-05-26 MED ORDER — MORPHINE SULFATE ER 15 MG PO TBCR
15.0000 mg | EXTENDED_RELEASE_TABLET | Freq: Three times a day (TID) | ORAL | Status: DC
  Administered 2015-05-26 – 2015-06-01 (×17): 15 mg via ORAL
  Filled 2015-05-26 (×18): qty 1

## 2015-05-26 MED ORDER — GABAPENTIN 600 MG PO TABS: 900.0000 mg | ORAL_TABLET | Freq: Three times a day (TID) | ORAL | Status: DC

## 2015-05-26 MED ORDER — ACETAMINOPHEN 325 MG PO TABS
650.0000 mg | ORAL_TABLET | Freq: Four times a day (QID) | ORAL | Status: DC | PRN
  Administered 2015-05-26: 650 mg via ORAL
  Filled 2015-05-26: qty 2

## 2015-05-26 MED ORDER — CYCLOBENZAPRINE HCL 10 MG PO TABS
10.0000 mg | ORAL_TABLET | Freq: Three times a day (TID) | ORAL | Status: DC
  Administered 2015-05-26 – 2015-06-01 (×18): 10 mg via ORAL
  Filled 2015-05-26 (×18): qty 1

## 2015-05-26 MED ORDER — VITAMIN D (ERGOCALCIFEROL) 1.25 MG (50000 UNIT) PO CAPS
50000.0000 [IU] | ORAL_CAPSULE | ORAL | Status: DC
  Administered 2015-05-27: 50000 [IU] via ORAL
  Filled 2015-05-26 (×2): qty 1

## 2015-05-26 MED ORDER — ALBUTEROL SULFATE HFA 108 (90 BASE) MCG/ACT IN AERS: 2.0000 | INHALATION_SPRAY | Freq: Four times a day (QID) | RESPIRATORY_TRACT | Status: DC | PRN

## 2015-05-26 MED ORDER — GLYCOPYRROLATE 1 MG PO TABS
1.0000 mg | ORAL_TABLET | Freq: Every evening | ORAL | Status: DC
  Administered 2015-05-26 – 2015-06-01 (×4): 1 mg via ORAL
  Filled 2015-05-26 (×9): qty 1

## 2015-05-26 MED ORDER — MIRTAZAPINE 15 MG PO TABS: 15.0000 mg | ORAL_TABLET | Freq: Every evening | ORAL | Status: DC | PRN

## 2015-05-26 MED ORDER — FERROUS SULFATE 325 (65 FE) MG PO TABS
325.0000 mg | ORAL_TABLET | Freq: Three times a day (TID) | ORAL | Status: DC
  Administered 2015-05-26 – 2015-06-01 (×18): 325 mg via ORAL
  Filled 2015-05-26 (×18): qty 1

## 2015-05-26 MED ORDER — MONTELUKAST SODIUM 10 MG PO TABS
10.0000 mg | ORAL_TABLET | Freq: Every day | ORAL | Status: DC
  Administered 2015-05-26 – 2015-05-31 (×6): 10 mg via ORAL
  Filled 2015-05-26 (×6): qty 1

## 2015-05-26 MED ORDER — GABAPENTIN 300 MG PO CAPS
900.0000 mg | ORAL_CAPSULE | Freq: Two times a day (BID) | ORAL | Status: DC
  Administered 2015-05-27 – 2015-06-01 (×12): 900 mg via ORAL
  Filled 2015-05-26 (×12): qty 3

## 2015-05-26 MED ORDER — OXYCODONE HCL 5 MG PO TABS
5.0000 mg | ORAL_TABLET | Freq: Once | ORAL | Status: AC
  Administered 2015-05-26: 5 mg via ORAL
  Filled 2015-05-26: qty 1

## 2015-05-26 MED ORDER — ADULT MULTIVITAMIN W/MINERALS CH
1.0000 | ORAL_TABLET | Freq: Every day | ORAL | Status: DC
  Administered 2015-05-27 – 2015-06-01 (×6): 1 via ORAL
  Filled 2015-05-26 (×6): qty 1

## 2015-05-26 MED ORDER — MOMETASONE FURO-FORMOTEROL FUM 100-5 MCG/ACT IN AERO
2.0000 | INHALATION_SPRAY | Freq: Two times a day (BID) | RESPIRATORY_TRACT | Status: DC
  Administered 2015-05-27 – 2015-06-01 (×11): 2 via RESPIRATORY_TRACT

## 2015-05-26 MED ORDER — LEVETIRACETAM 500 MG PO TABS
1000.0000 mg | ORAL_TABLET | Freq: Three times a day (TID) | ORAL | Status: DC
  Administered 2015-05-26: 1000 mg via ORAL
  Filled 2015-05-26: qty 2

## 2015-05-26 MED ORDER — SODIUM CHLORIDE 0.9 % IV SOLN
INTRAVENOUS | Status: AC
  Administered 2015-05-26: 22:00:00 via INTRAVENOUS

## 2015-05-26 NOTE — Progress Notes (Signed)
Patient being transferred from [employer] to [facility] SDU for continuous EEG and further evaluation per Neurology. [facility] spoke with Neurohospitalists, Dr Aram Beecham who agreed to consult. Please page neurology once patient arrives for formal consult. Patient accepted to SDU.

## 2015-05-26 NOTE — Progress Notes (Signed)
Report called to [place], CCU RN. Pt transferring to CCU 9. Per pt, okay to update husband on transfer to ICU. He is aware pt is transferring to ICU.

## 2015-05-26 NOTE — Progress Notes (Signed)
Pt had unwitnessed seizure. Nursing staff went into the room [phone] and saw pt seizing. Seizure ended at 0541. 1 mg ativan given. Postictal confusion, R sided tremors and difficulty verbalizing that subsided within 5 minutes. Incontinent episode. Vital signs stable. Patient now alert and oriented. Tearful about situation. MD notified. Will continue to monitor.   Iran Sizer M

## 2015-05-26 NOTE — Progress Notes (Signed)
Pt had RN witnessed seizure at 2152. ~61mn in duration, tonic/clonic movement in upper extremities, no incontinence, no N/V. Patients vital signs are stable and she is alert and oriented although somewhat slow to respond. MD was notified. Will continue to monitor.

## 2015-05-26 NOTE — Progress Notes (Signed)
At 0755 while patient was up to the bathroom with aide, pt experienced controlled shaking and loss of consciousness. Pt supported by aide while on toilet and was unable to support herself. 1 mg IV ativan given, per Dr. Benjie Karvonen who was present for the end of the episode. Pt transferred back to bed, lying on L side, seizure precautions in place. MD to place orders. Neuro check preformed post-ictal, PERRLA at 72m, noted weakness to R side, weak grip R hand, no dorsi/plantar flexion R foot, absent extension and flexion of R arm. Pt reporting numbness in bilat. legs.

## 2015-05-26 NOTE — Progress Notes (Signed)
i called [facility] no beds she is on long wait list. They are on divert.  I called [facility] has accepted patient
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
## 2015-05-26 NOTE — Discharge Summary (Signed)
[facility] [place] NAME: Jill Santiago    MR#:  [id number]  DATE OF BIRTH:  04-Sep-1970  DATE OF ADMISSION:  05/25/2015 ADMITTING PHYSICIAN: Hillary Bow, MD  DATE OF DISCHARGE: 05/26/2015 PRIMARY CARE PHYSICIAN: [facility]    ADMISSION DIAGNOSIS:  Seizure [facility]) [R56.9] Acute shoulder pain, right [M25.511]  DISCHARGE DIAGNOSIS:  Active Problems:   Seizures ([place])   SECONDARY DIAGNOSIS:   Past Medical History  Diagnosis Date  . Seizures ([place])   . FH: kidney cancer   . Throat cancer ([place])   . Iron deficiency anemia   . Hemorrhoids   . Chronic low back pain   . Urinary incontinence   . Sacroiliac joint dysfunction   . Ectopic pregnancy     HOSPITAL COURSE:   44 y/o female with a history of seizures presents with seizures.  1. Seizures: Patient has had 2 "seizures" this am, both of which were focal. I spoke with NEUROLOGY who recommends increasing Keppra to 1000 mg PO TID. It is unclear to our neurologist if she is having real seizures or not. He is recommending continuous EEG monitoring which we do not have at our hospital. Our neurologist is recommending the patient be transferred. He is spoken with the patient's husband who would like her to be transferred to [facility] if possible if not then [facility].   2. Rotator cuff INJURY: Patient will need ORTHO consult  3. Chronic pain syndrome: Continue outpatient medications.   DISCHARGE CONDITIONS AND DIET:   Regular diet Currently stable  CONSULTS OBTAINED:  Treatment Team:  Leotis Pain, MD  DRUG ALLERGIES:   Allergies  Allergen Reactions  . Azithromycin Hives  . Contrast Media [Iodinated Diagnostic Agents] Hives    DISCHARGE MEDICATIONS:   Current Discharge Medication List    CONTINUE these medications which have NOT CHANGED   Details  albuterol (PROVENTIL HFA;VENTOLIN HFA) 108 (90 BASE) MCG/ACT inhaler Inhale 2 puffs into the lungs every  6 (six) hours as needed for wheezing or shortness of breath.    albuterol (PROVENTIL) (2.5 MG/3ML) 0.083% nebulizer solution Take 2.5 mg by nebulization every 6 (six) hours as needed for wheezing or shortness of breath.    cyclobenzaprine (FLEXERIL) 10 MG tablet Take 10 mg by mouth 3 (three) times daily.    diphenoxylate-atropine (LOMOTIL) 2.5-0.025 MG tablet Take 1-2 tablets by mouth 4 (four) times daily as needed for diarrhea or loose stools.    ferrous sulfate 325 (65 FE) MG tablet Take 325 mg by mouth 3 (three) times daily.    Fluticasone-Salmeterol (ADVAIR) 250-50 MCG/DOSE AEPB Inhale 1 puff into the lungs 2 (two) times daily.    gabapentin (NEURONTIN) 600 MG tablet Take 900-1,200 mg by mouth 3 (three) times daily. Pt takes one and one-half tablet in the morning and at noon and two tablets at bedtime.    glycopyrrolate (ROBINUL) 1 MG tablet Take 1 mg by mouth every evening.    mirtazapine (REMERON) 15 MG tablet Take 15 mg by mouth at bedtime as needed (for sleep).    montelukast (SINGULAIR) 10 MG tablet Take 10 mg by mouth at bedtime.    morphine (MS CONTIN) 15 MG 12 hr tablet Take 15 mg by mouth every 8 (eight) hours.    Multiple Vitamin (MULTIVITAMIN WITH MINERALS) TABS tablet Take 1 tablet by mouth daily.    nortriptyline (PAMELOR) 75 MG capsule Take 75 mg by mouth at bedtime.    ondansetron (ZOFRAN) 4  MG tablet Take 4 mg by mouth every 8 (eight) hours as needed for nausea or vomiting.    oxyCODONE (OXY IR/ROXICODONE) 5 MG immediate release tablet Take 5 mg by mouth 3 (three) times daily as needed for severe pain.    solifenacin (VESICARE) 10 MG tablet Take 20 mg by mouth daily.    tiotropium (SPIRIVA) 18 MCG inhalation capsule Place 18 mcg into inhaler and inhale daily.    Vitamin D, Ergocalciferol, (DRISDOL) 50000 UNITS CAPS capsule Take 50,000 Units by mouth every 7 (seven) days.              Today   CHIEF COMPLAINT:  Patient reported to seizures  morning.   VITAL SIGNS:  Blood pressure 116/90, pulse 90, temperature 97.5 F (36.4 C), temperature source Oral, resp. rate 12, height 5' 6"  (1.676 m), weight 67.1 kg (147 lb 14.9 oz), SpO2 96 %.   REVIEW OF SYSTEMS:  Review of Systems  Constitutional: Negative for fever, chills and malaise/fatigue.  HENT: Negative for sore throat.   Eyes: Negative for blurred vision.  Respiratory: Negative for cough, hemoptysis, shortness of breath and wheezing.   Cardiovascular: Negative for chest pain, palpitations and leg swelling.  Gastrointestinal: Negative for nausea, vomiting, abdominal pain, diarrhea and blood in stool.  Genitourinary: Negative for dysuria.  Musculoskeletal: Negative for back pain.  Neurological: Positive for seizures. Negative for dizziness, tremors and headaches.  Endo/Heme/Allergies: Does not bruise/bleed easily.     PHYSICAL EXAMINATION:  GENERAL:  44 y.o.-year-old patient lying in the bed with no acute distress.  NECK:  Supple, no jugular venous distention. No thyroid enlargement, no tenderness.  LUNGS: Normal breath sounds bilaterally, no wheezing, rales,rhonchi  No use of accessory muscles of respiration.  CARDIOVASCULAR: S1, S2 normal. No murmurs, rubs, or gallops.  ABDOMEN: Soft, non-tender, non-distended. Bowel sounds present. No organomegaly or mass.  EXTREMITIES: No pedal edema, cyanosis, or clubbing.  PSYCHIATRIC: The patient is alert and oriented x 3.  SKIN: No obvious rash, lesion, or ulcer.   DATA REVIEW:   CBC  Recent Labs Lab 05/25/15 1439  WBC 9.2  HGB 13.3  HCT 41.2  PLT 279    Chemistries   Recent Labs Lab 05/25/15 1439  NA 137  K 4.1  CL 101  CO2 24  GLUCOSE 99  BUN 7  CREATININE 0.65  CALCIUM 8.9  AST 17  ALT 10*  ALKPHOS 38  BILITOT 0.6    Cardiac Enzymes No results for input(s): TROPONINI in the last 168 hours.  Microbiology Results  @[email or web address] @  RADIOLOGY:  Dg Chest 2 View  05/25/2015  CLINICAL DATA:   Seizure at home, fell onto RIGHT shoulder, RIGHT shoulder pain EXAM: CHEST  2 VIEW COMPARISON:  07/21/2008 FINDINGS: Normal heart size, mediastinal contours, and pulmonary vascularity. Mild chronic bronchitic changes. Lungs otherwise clear. No pleural effusion or pneumothorax. No acute bony abnormalities. IMPRESSION: Mild chronic bronchitic changes without infiltrate or evidence of acute injury. Electronically Signed   By: Lavonia Dana M.D.   On: 05/25/2015 15:44   Dg Shoulder Right  05/25/2015  CLINICAL DATA:  Seizure, right shoulder pain EXAM: RIGHT SHOULDER - 2+ VIEW COMPARISON:  None. FINDINGS: Three views of the right shoulder submitted. No acute fracture or subluxation. There is high riding humeral head suspicious for rotator cuff injury. IMPRESSION: No displaced fracture or subluxation. High riding humeral head suspicious for rotator cuff injury Electronically Signed   By: Lahoma Crocker M.D.   On: 05/25/2015 15:41   Ct  Head Wo Contrast  05/25/2015  CLINICAL DATA:  Seizure at home, RIGHT shoulder pain/injury, history of seizure disorder, throat cancer EXAM: CT HEAD WITHOUT CONTRAST TECHNIQUE: Contiguous axial images were obtained from the base of the skull through the vertex without intravenous contrast. COMPARISON:  03/28/2007 CT orbits FINDINGS: Normal ventricular morphology. No midline shift or mass effect. Normal appearance of brain parenchyma. No intracranial hemorrhage, mass lesion, or acute infarction. Minimal scattered mucosal thickening of the ethmoid air cells. Visualized paranasal sinuses and mastoid air cells otherwise clear. Bones unremarkable. IMPRESSION: No acute intracranial abnormalities. If patient has persistent or recurrent seizures, recommend followup MR imaging of the brain for further assessment. Electronically Signed   By: Lavonia Dana M.D.   On: 05/25/2015 15:31   Mr Brain Wo Contrast  05/25/2015  CLINICAL DATA:  Seizure at home and in the ambulance today. Initial encounter.  EXAM: MRI HEAD WITHOUT CONTRAST TECHNIQUE: Multiplanar, multiecho pulse sequences of the brain and surrounding structures were obtained without intravenous contrast. COMPARISON:  CT head without contrast 05/25/2015 FINDINGS: No acute infarct, hemorrhage, or mass lesion is present. The ventricles are of normal size. No significant extraaxial fluid collection is present. Dedicated imaging of the temporal lobes demonstrate symmetric size and signal of the hippocampal structures. The internal auditory canals are within normal limits bilaterally. Flow is present in the major intracranial arteries. The globes and orbits are intact. Mild mucosal thickening is scattered throughout the ethmoid air cells. The remaining paranasal sinuses are clear. Skullbase is within normal limits. Midline structures are unremarkable. IMPRESSION: Negative MRI of the brain. Electronically Signed   By: San Morelle M.D.   On: 05/25/2015 17:28   Mr Cervical Spine Wo Contrast  05/25/2015  CLINICAL DATA:  Seizure today. Right shoulder pain and injury. Right hand tremors. EXAM: MRI CERVICAL SPINE WITHOUT CONTRAST TECHNIQUE: Multiplanar, multisequence MR imaging of the cervical spine was performed. No intravenous contrast was administered. COMPARISON:  Cervical spine CT 09/19/2008 FINDINGS: Normal signal is present in the cervical and upper thoracic spinal cord to the lowest imaged level, T2-3. Marrow signal, vertebral body heights, and alignment are normal. The craniocervical junction is within normal limits. The visualized intracranial contents are normal. C2-3:  Negative. C3-4: A rightward disc osteophyte complex partially effaces the ventral CSF. Uncovertebral spurring contributes to mild right foraminal narrowing. C4-5: A broad-based disc osteophyte complex is present. Uncovertebral and facet disease is worse on the right. This contributes to moderate right and mild left foraminal narrowing. C5-6: A broad-based disc osteophyte  complex is present. Facet hypertrophy is worse on the right. Facet hypertrophy and uncovertebral spurring is worse on the right. Moderate right and mild left foraminal stenosis is present. C6-7: Asymmetric rightward uncovertebral spurring contributes to mild right foraminal stenosis. C7-T1:  Negative. IMPRESSION: 1. Multilevel spondylosis of the cervical spine is worse right than left. 2. Mild right central and foraminal stenosis at C3-4. 3. Moderate right and mild left foraminal narrowing at C4-5 and C5-6. 4. Mild right foraminal narrowing at C6-7. Electronically Signed   By: San Morelle M.D.   On: 05/25/2015 17:33      Management plans discussed with the patient and she is in agreement. Stable for transfer  Patient should follow up with neurology  CODE STATUS:     Code Status Orders        Start     Ordered   05/25/15 1826  Full code   Continuous     05/25/15 1826  TOTAL TIME TAKING CARE OF THIS PATIENT: 35 minutes.    Note: This dictation was prepared with Dragon dictation along with smaller phrase technology. Any transcriptional errors that result from this process are unintentional.  Tiawanna Luchsinger M.D on 05/26/2015 at 12:02 PM  Between 7am to 6pm - Pager - [phone] After 6pm go to [email or web address] - password [email or web address] [facility]  [facility]  Office  [phone]  CC: Primary care physician; [place]

## 2015-05-26 NOTE — H&P (Signed)
[facility] History and Physical  Jill Santiago JEH:[id number] DOB: November 20, 1970 DOA: 05/26/2015  Referring physician: Patient was transferred from [facility]. PCP: [place]  Specialists: Patient follows up at [facility] for urology.  Chief Complaint: Seizures.  HPI: Jill Santiago is a 44 y.o. female with history of seizures, chronic pain, COPD was transferred from [facility] after patient had multiple episodes of seizures. Patient was admitted last evening after patient had an episode of seizure. Patient's friend was at the bedside states he had weakness generalized tonic-clonic seizure involving upper extremities lasting for around 8 minutes. Patient was taken to the ER following which patient had at least 3 more seizures. Patient again had 3 more seizures today and was transferred to [facility] for continuous EEG monitoring. [facility] had another seizure which was tonic-clonic involving the upper extremities but had no postictal phase. Patient did not have any incontinence of urine. MRI of the brain and C-spine done at [facility] was unremarkable. Patient was started on Keppra and [facility]. Patient is usually on Neurontin. On exam patient has mild weakness of the right upper and lower extremity which patient states has progressively worsened over the last 24 hours. X-rays done of the right shoulder at [facility] was concerning for possible right shoulder rotator cuff tear.  Review of Systems: As presented in the history of presenting illness, rest negative.  Past Medical History  Diagnosis Date  . Seizures ([place])   . FH: kidney cancer   . Throat cancer ([place])   . Iron deficiency anemia   . Hemorrhoids   . Chronic low back pain   . Urinary incontinence   . Sacroiliac joint dysfunction   . Ectopic pregnancy   . COPD (chronic obstructive  pulmonary disease) [facility])    Past Surgical History  Procedure Laterality Date  . Back surgery    . Knee surgery    . Ectopic pregnancy surgery     Social History:  reports that she has never smoked. She does not have any smokeless tobacco history on file. She reports that she does not drink alcohol or use illicit drugs. Where does patient live home. Can patient participate in ADLs? Yes.  Allergies  Allergen Reactions  . Azithromycin Hives  . Contrast Media [Iodinated Diagnostic Agents] Hives    Family History:  Family History  Problem Relation Age of Onset  . Family history unknown: Yes      Prior to Admission medications   Medication Sig Start Date End Date Taking? Authorizing Provider  albuterol (PROVENTIL HFA;VENTOLIN HFA) 108 (90 BASE) MCG/ACT inhaler Inhale 2 puffs into the lungs every 6 (six) hours as needed for wheezing or shortness of breath.   Yes Historical Provider, MD  albuterol (PROVENTIL) (2.5 MG/3ML) 0.083% nebulizer solution Take 2.5 mg by nebulization every 6 (six) hours as needed for wheezing or shortness of breath.   Yes Historical Provider, MD  cyclobenzaprine (FLEXERIL) 10 MG tablet Take 10 mg by mouth 3 (three) times daily.   Yes Historical Provider, MD  diphenoxylate-atropine (LOMOTIL) 2.5-0.025 MG tablet Take 1-2 tablets by mouth 4 (four) times daily as needed for diarrhea or loose stools.   Yes Historical Provider, MD  ferrous sulfate 325 (65 FE) MG tablet Take 325 mg by mouth 3 (three) times daily.   Yes Historical Provider, MD  Fluticasone-Salmeterol (ADVAIR) 250-50 MCG/DOSE AEPB Inhale 1 puff into the lungs 2 (two) times  daily.   Yes Historical Provider, MD  gabapentin (NEURONTIN) 600 MG tablet Take 900-1,200 mg by mouth 3 (three) times daily. Pt takes one and one-half tablet in the morning and at noon and two tablets at bedtime.   Yes Historical Provider, MD  glycopyrrolate (ROBINUL) 1 MG tablet Take 1 mg by mouth every evening.   Yes Historical Provider,  MD  mirtazapine (REMERON) 15 MG tablet Take 15 mg by mouth at bedtime as needed (for sleep).   Yes Historical Provider, MD  montelukast (SINGULAIR) 10 MG tablet Take 10 mg by mouth at bedtime.   Yes Historical Provider, MD  morphine (MS CONTIN) 15 MG 12 hr tablet Take 15 mg by mouth every 8 (eight) hours.   Yes Historical Provider, MD  Multiple Vitamin (MULTIVITAMIN WITH MINERALS) TABS tablet Take 1 tablet by mouth daily.   Yes Historical Provider, MD  nortriptyline (PAMELOR) 75 MG capsule Take 75 mg by mouth at bedtime.   Yes Historical Provider, MD  ondansetron (ZOFRAN) 4 MG tablet Take 4 mg by mouth every 8 (eight) hours as needed for nausea or vomiting.   Yes Historical Provider, MD  oxyCODONE (OXY IR/ROXICODONE) 5 MG immediate release tablet Take 5 mg by mouth 3 (three) times daily as needed for severe pain.   Yes Historical Provider, MD  solifenacin (VESICARE) 10 MG tablet Take 20 mg by mouth daily.   Yes Historical Provider, MD  tiotropium (SPIRIVA) 18 MCG inhalation capsule Place 18 mcg into inhaler and inhale daily.   Yes Historical Provider, MD  Vitamin D, Ergocalciferol, (DRISDOL) 50000 UNITS CAPS capsule Take 50,000 Units by mouth every 7 (seven) days.   Yes Historical Provider, MD    Physical Exam: Filed Vitals:   05/26/15 1840 05/26/15 2130  BP: 117/89 105/60  Temp: 98 F (36.7 C) 97.8 F (36.6 C)  TempSrc: Oral Oral  Resp: 10   Weight: 66.8 kg (147 lb 4.3 oz)      General:  Moderately built and nourished.  Eyes: Anicteric no pallor.  ENT: No discharge from the ears eyes nose and mouth.  Neck: No mass felt. No neck rigidity.  Cardiovascular: S1-S2 heard.  Respiratory: No rhonchi or crepitations.  Abdomen: Soft nontender bowel sounds present.  Skin: No rash.  Musculoskeletal: No edema. Pain on moving right upper extremity.  Psychiatric: Appears normal.  Neurologic: Alert awake oriented to time place and person. Patient has mild weakness of the right upper  extremity and right lower extremity.  Labs on Admission:  Basic Metabolic Panel:  Recent Labs Lab 05/25/15 1439  NA 137  K 4.1  CL 101  CO2 24  GLUCOSE 99  BUN 7  CREATININE 0.65  CALCIUM 8.9   Liver Function Tests:  Recent Labs Lab 05/25/15 1439  AST 17  ALT 10*  ALKPHOS 38  BILITOT 0.6  PROT 7.2  ALBUMIN 4.4    Recent Labs Lab 05/25/15 1439  LIPASE 18   No results for input(s): AMMONIA in the last 168 hours. CBC:  Recent Labs Lab 05/25/15 1439  WBC 9.2  HGB 13.3  HCT 41.2  MCV 86.0  PLT 279   Cardiac Enzymes: No results for input(s): CKTOTAL, CKMB, CKMBINDEX, TROPONINI in the last 168 hours.  BNP (last 3 results) No results for input(s): BNP in the last 8760 hours.  ProBNP (last 3 results) No results for input(s): PROBNP in the last 8760 hours.  CBG:  Recent Labs Lab 05/25/15 1446 05/26/15 0924 05/26/15 1448 05/26/15 1614  GLUCAP 93 85 104* 83    Radiological Exams on Admission: Dg Chest 2 View  05/25/2015  CLINICAL DATA:  Seizure at home, fell onto RIGHT shoulder, RIGHT shoulder pain EXAM: CHEST  2 VIEW COMPARISON:  07/21/2008 FINDINGS: Normal heart size, mediastinal contours, and pulmonary vascularity. Mild chronic bronchitic changes. Lungs otherwise clear. No pleural effusion or pneumothorax. No acute bony abnormalities. IMPRESSION: Mild chronic bronchitic changes without infiltrate or evidence of acute injury. Electronically Signed   By: Lavonia Dana M.D.   On: 05/25/2015 15:44   Dg Shoulder Right  05/25/2015  CLINICAL DATA:  Seizure, right shoulder pain EXAM: RIGHT SHOULDER - 2+ VIEW COMPARISON:  None. FINDINGS: Three views of the right shoulder submitted. No acute fracture or subluxation. There is high riding humeral head suspicious for rotator cuff injury. IMPRESSION: No displaced fracture or subluxation. High riding humeral head suspicious for rotator cuff injury Electronically Signed   By: Lahoma Crocker M.D.   On: 05/25/2015 15:41    Ct Head Wo Contrast  05/25/2015  CLINICAL DATA:  Seizure at home, RIGHT shoulder pain/injury, history of seizure disorder, throat cancer EXAM: CT HEAD WITHOUT CONTRAST TECHNIQUE: Contiguous axial images were obtained from the base of the skull through the vertex without intravenous contrast. COMPARISON:  03/28/2007 CT orbits FINDINGS: Normal ventricular morphology. No midline shift or mass effect. Normal appearance of brain parenchyma. No intracranial hemorrhage, mass lesion, or acute infarction. Minimal scattered mucosal thickening of the ethmoid air cells. Visualized paranasal sinuses and mastoid air cells otherwise clear. Bones unremarkable. IMPRESSION: No acute intracranial abnormalities. If patient has persistent or recurrent seizures, recommend followup MR imaging of the brain for further assessment. Electronically Signed   By: Lavonia Dana M.D.   On: 05/25/2015 15:31   Mr Brain Wo Contrast  05/25/2015  CLINICAL DATA:  Seizure at home and in the ambulance today. Initial encounter. EXAM: MRI HEAD WITHOUT CONTRAST TECHNIQUE: Multiplanar, multiecho pulse sequences of the brain and surrounding structures were obtained without intravenous contrast. COMPARISON:  CT head without contrast 05/25/2015 FINDINGS: No acute infarct, hemorrhage, or mass lesion is present. The ventricles are of normal size. No significant extraaxial fluid collection is present. Dedicated imaging of the temporal lobes demonstrate symmetric size and signal of the hippocampal structures. The internal auditory canals are within normal limits bilaterally. Flow is present in the major intracranial arteries. The globes and orbits are intact. Mild mucosal thickening is scattered throughout the ethmoid air cells. The remaining paranasal sinuses are clear. Skullbase is within normal limits. Midline structures are unremarkable. IMPRESSION: Negative MRI of the brain. Electronically Signed   By: San Morelle M.D.   On: 05/25/2015 17:28    Mr Cervical Spine Wo Contrast  05/25/2015  CLINICAL DATA:  Seizure today. Right shoulder pain and injury. Right hand tremors. EXAM: MRI CERVICAL SPINE WITHOUT CONTRAST TECHNIQUE: Multiplanar, multisequence MR imaging of the cervical spine was performed. No intravenous contrast was administered. COMPARISON:  Cervical spine CT 09/19/2008 FINDINGS: Normal signal is present in the cervical and upper thoracic spinal cord to the lowest imaged level, T2-3. Marrow signal, vertebral body heights, and alignment are normal. The craniocervical junction is within normal limits. The visualized intracranial contents are normal. C2-3:  Negative. C3-4: A rightward disc osteophyte complex partially effaces the ventral CSF. Uncovertebral spurring contributes to mild right foraminal narrowing. C4-5: A broad-based disc osteophyte complex is present. Uncovertebral and facet disease is worse on the right. This contributes to moderate right and mild left foraminal narrowing. C5-6: A  broad-based disc osteophyte complex is present. Facet hypertrophy is worse on the right. Facet hypertrophy and uncovertebral spurring is worse on the right. Moderate right and mild left foraminal stenosis is present. C6-7: Asymmetric rightward uncovertebral spurring contributes to mild right foraminal stenosis. C7-T1:  Negative. IMPRESSION: 1. Multilevel spondylosis of the cervical spine is worse right than left. 2. Mild right central and foraminal stenosis at C3-4. 3. Moderate right and mild left foraminal narrowing at C4-5 and C5-6. 4. Mild right foraminal narrowing at C6-7. Electronically Signed   By: San Morelle M.D.   On: 05/25/2015 17:33   Dg Pelvis Portable  05/26/2015  CLINICAL DATA:  Pain following seizure EXAM: PORTABLE PELVIS 1-2 VIEWS COMPARISON:  None. FINDINGS: There is no evidence of pelvic fracture or dislocation. Joint spaces appear intact. No erosive change. IMPRESSION: No demonstrable fracture or dislocation.  No apparent  arthropathy. Electronically Signed   By: Lowella Grip III M.D.   On: 05/26/2015 21:52     Assessment/Plan Principal Problem:   Seizures ([place]) Active Problems:   Chronic pain syndrome   Seizure ([place])   1. Seizure-like activity - I have consulted Dr. Wallie Char on-call neurologist for further recommendations. Patient will be kept on when necessary IV Ativan for any seizure-like activities. Patient is on Neurontin. Further recommendations per neurologist. 2. Right shoulder pain with possible rotator cuff tear - will need orthopedic consult in a.m. 3. Chronic pain syndrome - continue home medications. 4. COPD/bronchitis - continue inhalers. Patient is not actively wheezing at this time.  Patient also states she was diagnosed with renal cell carcinoma and also has had previous neck mass.   DVT Prophylaxis SCDs.  Code Status: Full code.  Family Communication: Discussed with patient.  Disposition Plan: Admit to inpatient.    Dirck Butch N. [facility] Pager [phone].  If 7PM-7AM, please contact night-coverage [email or web address] Password [email or web address] 05/26/2015, 10:10 PM

## 2015-05-26 NOTE — Progress Notes (Signed)
Pt was received from 2A with Maddie, RN. Upon arrival to the unit, pt was alert then started having a partial seizure at 0916. 76m of Ativan was administered. Seizure lasted until 0921. 5 minutes post seizure, pt was able to respond and follow commands. Pt is now resting. Will continue to monitor.

## 2015-05-26 NOTE — Consult Note (Signed)
CC: seizures  HPI: Jill Santiago is an 44 y.o. female female with a known history of chronic back pain, anxiety, seizures on Neurontin presents to the emergency room with 3 episodes of seizures.  Since admission pt has had multiple seizures.  Pt's seizures are short, described as tonic/clonic with limited to no post ictal state as well as no urinary incontinence or tongue biting.   Past Medical History  Diagnosis Date  . Seizures ([place])   . FH: kidney cancer   . Throat cancer ([place])   . Iron deficiency anemia   . Hemorrhoids   . Chronic low back pain   . Urinary incontinence   . Sacroiliac joint dysfunction   . Ectopic pregnancy     History reviewed. No pertinent past surgical history.  History reviewed. No pertinent family history.  Social History:  reports that she has never smoked. She does not have any smokeless tobacco history on file. She reports that she does not drink alcohol or use illicit drugs.  Allergies  Allergen Reactions  . Azithromycin Hives  . Contrast Media [Iodinated Diagnostic Agents] Hives    Medications: I have reviewed the patient's current medications.    Physical Examination: Blood pressure 115/84, pulse 87, temperature 97.8 F (36.6 C), temperature source Oral, resp. rate 11, height 5' 6"  (1.676 m), weight 147 lb 14.9 oz (67.1 kg), SpO2 97 %.  Would open her eyes  Withdraw to pain Responds to visual threats bilaterally Was able to hold her hands up with minimal to no drift.  4/5 b/l LE  Laboratory Studies:   Basic Metabolic Panel:  Recent Labs Lab 05/25/15 1439  NA 137  K 4.1  CL 101  CO2 24  GLUCOSE 99  BUN 7  CREATININE 0.65  CALCIUM 8.9    Liver Function Tests:  Recent Labs Lab 05/25/15 1439  AST 17  ALT 10*  ALKPHOS 38  BILITOT 0.6  PROT 7.2  ALBUMIN 4.4    Recent Labs Lab 05/25/15 1439  LIPASE 18   No results for input(s): AMMONIA in the last 168 hours.  CBC:  Recent Labs Lab 05/25/15 1439  WBC 9.2   HGB 13.3  HCT 41.2  MCV 86.0  PLT 279    Cardiac Enzymes: No results for input(s): CKTOTAL, CKMB, CKMBINDEX, TROPONINI in the last 168 hours.  BNP: Invalid input(s): POCBNP  CBG:  Recent Labs Lab 05/25/15 1446 05/26/15 0924 05/26/15 1448 05/26/15 1614  GLUCAP 93 85 104* 33    Microbiology: Results for orders placed or performed during the hospital encounter of 05/25/15  MRSA PCR Screening     Status: None   Collection Time: 05/26/15  9:29 AM  Result Value Ref Range Status   MRSA by PCR NEGATIVE NEGATIVE Final    Comment:        The GeneXpert MRSA Assay (FDA approved for NASAL specimens only), is one component of a comprehensive MRSA colonization surveillance program. It is not intended to diagnose MRSA infection nor to guide or monitor treatment for MRSA infections.     Coagulation Studies: No results for input(s): LABPROT, INR in the last 72 hours.  Urinalysis:  Recent Labs Lab 05/25/15 1800  COLORURINE YELLOW*  LABSPEC 1.004*  PHURINE 6.0  GLUCOSEU NEGATIVE  HGBUR 1+*  BILIRUBINUR NEGATIVE  KETONESUR NEGATIVE  PROTEINUR NEGATIVE  NITRITE POSITIVE*  LEUKOCYTESUR NEGATIVE    Lipid Panel:  No results found for: CHOL, TRIG, HDL, CHOLHDL, VLDL, LDLCALC  HgbA1C: No results found for: HGBA1C  Urine Drug Screen:     Component Value Date/Time   LABOPIA POSITIVE* 05/25/2015 1800   LABBENZ POSITIVE* 05/25/2015 1800   AMPHETMU NONE DETECTED 05/25/2015 1800   THCU POSITIVE* 05/25/2015 1800   LABBARB NONE DETECTED 05/25/2015 1800    Alcohol Level: No results for input(s): ETH in the last 168 hours.    Imaging: Dg Chest 2 View  05/25/2015  CLINICAL DATA:  Seizure at home, fell onto RIGHT shoulder, RIGHT shoulder pain EXAM: CHEST  2 VIEW COMPARISON:  07/21/2008 FINDINGS: Normal heart size, mediastinal contours, and pulmonary vascularity. Mild chronic bronchitic changes. Lungs otherwise clear. No pleural effusion or pneumothorax. No acute bony  abnormalities. IMPRESSION: Mild chronic bronchitic changes without infiltrate or evidence of acute injury. Electronically Signed   By: Lavonia Dana M.D.   On: 05/25/2015 15:44   Dg Shoulder Right  05/25/2015  CLINICAL DATA:  Seizure, right shoulder pain EXAM: RIGHT SHOULDER - 2+ VIEW COMPARISON:  None. FINDINGS: Three views of the right shoulder submitted. No acute fracture or subluxation. There is high riding humeral head suspicious for rotator cuff injury. IMPRESSION: No displaced fracture or subluxation. High riding humeral head suspicious for rotator cuff injury Electronically Signed   By: Lahoma Crocker M.D.   On: 05/25/2015 15:41   Ct Head Wo Contrast  05/25/2015  CLINICAL DATA:  Seizure at home, RIGHT shoulder pain/injury, history of seizure disorder, throat cancer EXAM: CT HEAD WITHOUT CONTRAST TECHNIQUE: Contiguous axial images were obtained from the base of the skull through the vertex without intravenous contrast. COMPARISON:  03/28/2007 CT orbits FINDINGS: Normal ventricular morphology. No midline shift or mass effect. Normal appearance of brain parenchyma. No intracranial hemorrhage, mass lesion, or acute infarction. Minimal scattered mucosal thickening of the ethmoid air cells. Visualized paranasal sinuses and mastoid air cells otherwise clear. Bones unremarkable. IMPRESSION: No acute intracranial abnormalities. If patient has persistent or recurrent seizures, recommend followup MR imaging of the brain for further assessment. Electronically Signed   By: Lavonia Dana M.D.   On: 05/25/2015 15:31   Mr Brain Wo Contrast  05/25/2015  CLINICAL DATA:  Seizure at home and in the ambulance today. Initial encounter. EXAM: MRI HEAD WITHOUT CONTRAST TECHNIQUE: Multiplanar, multiecho pulse sequences of the brain and surrounding structures were obtained without intravenous contrast. COMPARISON:  CT head without contrast 05/25/2015 FINDINGS: No acute infarct, hemorrhage, or mass lesion is present. The  ventricles are of normal size. No significant extraaxial fluid collection is present. Dedicated imaging of the temporal lobes demonstrate symmetric size and signal of the hippocampal structures. The internal auditory canals are within normal limits bilaterally. Flow is present in the major intracranial arteries. The globes and orbits are intact. Mild mucosal thickening is scattered throughout the ethmoid air cells. The remaining paranasal sinuses are clear. Skullbase is within normal limits. Midline structures are unremarkable. IMPRESSION: Negative MRI of the brain. Electronically Signed   By: San Morelle M.D.   On: 05/25/2015 17:28   Mr Cervical Spine Wo Contrast  05/25/2015  CLINICAL DATA:  Seizure today. Right shoulder pain and injury. Right hand tremors. EXAM: MRI CERVICAL SPINE WITHOUT CONTRAST TECHNIQUE: Multiplanar, multisequence MR imaging of the cervical spine was performed. No intravenous contrast was administered. COMPARISON:  Cervical spine CT 09/19/2008 FINDINGS: Normal signal is present in the cervical and upper thoracic spinal cord to the lowest imaged level, T2-3. Marrow signal, vertebral body heights, and alignment are normal. The craniocervical junction is within normal limits. The visualized intracranial contents are normal. C2-3:  Negative. C3-4: A rightward disc osteophyte complex partially effaces the ventral CSF. Uncovertebral spurring contributes to mild right foraminal narrowing. C4-5: A broad-based disc osteophyte complex is present. Uncovertebral and facet disease is worse on the right. This contributes to moderate right and mild left foraminal narrowing. C5-6: A broad-based disc osteophyte complex is present. Facet hypertrophy is worse on the right. Facet hypertrophy and uncovertebral spurring is worse on the right. Moderate right and mild left foraminal stenosis is present. C6-7: Asymmetric rightward uncovertebral spurring contributes to mild right foraminal stenosis. C7-T1:   Negative. IMPRESSION: 1. Multilevel spondylosis of the cervical spine is worse right than left. 2. Mild right central and foraminal stenosis at C3-4. 3. Moderate right and mild left foraminal narrowing at C4-5 and C5-6. 4. Mild right foraminal narrowing at C6-7. Electronically Signed   By: San Morelle M.D.   On: 05/25/2015 17:33     Assessment/Plan:  44 y.o. female female with a known history of chronic back pain, anxiety, seizures on Neurontin presents to the emergency room with 3 episodes of seizures.  Since admission pt has had multiple seizures.  Pt's seizures are short, described as tonic/clonic with limited to no post ictal state as well as no urinary incontinence or tongue biting.   According to pt's husband ( possibly ex husband) they had a recent argument and there is increased stress in her life After discussions with pt's family and her symptoms I am not convinced pt has true epileptic seizures.   There is limited to no post ictal state with generalzied seizures   Pt was started on keppra No EEG capability at [facility]. Pt is waiting to be transferred for EEG monitoring.   Long discussion with family and reassurance  05/26/2015, 6:49 PM

## 2015-05-26 NOTE — Consult Note (Signed)
Admission H&P    Chief Complaint: Recurrent episodes of seizure-like activity.  HPI: Jill Santiago is an 44 y.o. female with a history of seizure disorder since 44 years old, throat cancer, anemia, COPD and chronic pain disorder, presenting with recurrent episodes of generalized shaking of extremities and reduced responsiveness but no clear loss of consciousness. Patient also has not exhibited postictal state following his episodes which began yesterday. He had multiple witnessed episodes at [facility] She was described as being communicative during at least 1 of her spells. She has been on Neurontin reportedly for seizure control. He is not on the additional anticonvulsant medications and was not given additional medication at [facility]. He was transferred for video EEG monitoring to rule out possible nonepileptic seizures. MRI of her brain was negative.  Past Medical History  Diagnosis Date  . Seizures ([place])   . FH: kidney cancer   . Throat cancer ([place])   . Iron deficiency anemia   . Hemorrhoids   . Chronic low back pain   . Urinary incontinence   . Sacroiliac joint dysfunction   . Ectopic pregnancy   . COPD (chronic obstructive pulmonary disease) [facility])     Past Surgical History  Procedure Laterality Date  . Back surgery    . Knee surgery    . Ectopic pregnancy surgery      Family History  Problem Relation Age of Onset  . Family history unknown: Yes   Social History:  reports that she has never smoked. She does not have any smokeless tobacco history on file. She reports that she does not drink alcohol or use illicit drugs.  Allergies:  Allergies  Allergen Reactions  . Azithromycin Hives  . Contrast Media [Iodinated Diagnostic Agents] Hives    Medications Prior to Admission  Medication Sig Dispense Refill  . albuterol (PROVENTIL HFA;VENTOLIN HFA) 108 (90 BASE) MCG/ACT inhaler Inhale 2 puffs into the lungs every 6 (six) hours as needed for wheezing or shortness of breath.    Marland Kitchen  albuterol (PROVENTIL) (2.5 MG/3ML) 0.083% nebulizer solution Take 2.5 mg by nebulization every 6 (six) hours as needed for wheezing or shortness of breath.    . cyclobenzaprine (FLEXERIL) 10 MG tablet Take 10 mg by mouth 3 (three) times daily.    . diphenoxylate-atropine (LOMOTIL) 2.5-0.025 MG tablet Take 1-2 tablets by mouth 4 (four) times daily as needed for diarrhea or loose stools.    . ferrous sulfate 325 (65 FE) MG tablet Take 325 mg by mouth 3 (three) times daily.    . Fluticasone-Salmeterol (ADVAIR) 250-50 MCG/DOSE AEPB Inhale 1 puff into the lungs 2 (two) times daily.    Marland Kitchen gabapentin (NEURONTIN) 600 MG tablet Take 900-1,200 mg by mouth 3 (three) times daily. Pt takes one and one-half tablet in the morning and at noon and two tablets at bedtime.    Marland Kitchen glycopyrrolate (ROBINUL) 1 MG tablet Take 1 mg by mouth every evening.    . mirtazapine (REMERON) 15 MG tablet Take 15 mg by mouth at bedtime as needed (for sleep).    . montelukast (SINGULAIR) 10 MG tablet Take 10 mg by mouth at bedtime.    Marland Kitchen morphine (MS CONTIN) 15 MG 12 hr tablet Take 15 mg by mouth every 8 (eight) hours.    . Multiple Vitamin (MULTIVITAMIN WITH MINERALS) TABS tablet Take 1 tablet by mouth daily.    . nortriptyline (PAMELOR) 75 MG capsule Take 75 mg by mouth at bedtime.    . ondansetron (ZOFRAN) 4 MG tablet  Take 4 mg by mouth every 8 (eight) hours as needed for nausea or vomiting.    Marland Kitchen oxyCODONE (OXY IR/ROXICODONE) 5 MG immediate release tablet Take 5 mg by mouth 3 (three) times daily as needed for severe pain.    Marland Kitchen solifenacin (VESICARE) 10 MG tablet Take 20 mg by mouth daily.    Marland Kitchen tiotropium (SPIRIVA) 18 MCG inhalation capsule Place 18 mcg into inhaler and inhale daily.    . Vitamin D, Ergocalciferol, (DRISDOL) 50000 UNITS CAPS capsule Take 50,000 Units by mouth every 7 (seven) days.      ROS: History obtained from spouse and the patient  General ROS: negative for - chills, fatigue, fever, night sweats, weight gain or  weight loss Psychological ROS: negative for - behavioral disorder, hallucinations, memory difficulties, mood swings or suicidal ideation Ophthalmic ROS: negative for - blurry vision, double vision, eye pain or loss of vision ENT ROS: negative for - epistaxis, nasal discharge, oral lesions, sore throat, tinnitus or vertigo Allergy and Immunology ROS: negative for - hives or itchy/watery eyes Hematological and Lymphatic ROS: negative for - bleeding problems, bruising or swollen lymph nodes Endocrine ROS: negative for - galactorrhea, hair pattern changes, polydipsia/polyuria or temperature intolerance Respiratory ROS: negative for - cough, hemoptysis, shortness of breath or wheezing Cardiovascular ROS: negative for - chest pain, dyspnea on exertion, edema or irregular heartbeat Gastrointestinal ROS: negative for - abdominal pain, diarrhea, hematemesis, nausea/vomiting or stool incontinence Genito-Urinary ROS: negative for - dysuria, hematuria, incontinence or urinary frequency/urgency Musculoskeletal ROS: negative for - joint swelling or muscular weakness Neurological ROS: as noted in HPI Dermatological ROS: negative for rash and skin lesion changes  Physical Examination: Blood pressure 105/60, temperature 97.8 F (36.6 C), temperature source Oral, resp. rate 10, weight 66.8 kg (147 lb 4.3 oz).  HEENT-  Normocephalic, no lesions, without obvious abnormality.  Normal external eye and conjunctiva.  Normal TM's bilaterally.  Normal auditory canals and external ears. Normal external nose, mucus membranes and septum.  Normal pharynx. Neck supple with no masses, nodes, nodules or enlargement. Cardiovascular - regular rate and rhythm, S1, S2 normal, no murmur, click, rub or gallop Lungs - chest clear, no wheezing, rales, normal symmetric air entry Abdomen - soft, non-tender; bowel sounds normal; no masses,  no organomegaly Extremities - no joint deformities, effusion, or inflammation and no  edema  Neurologic Examination: Mental Status: Alert, oriented, flat, depressed affect.  Speech fluent without evidence of aphasia. Able to follow commands without difficulty. Cranial Nerves: II-Visual fields were normal. III/IV/VI-Pupils were equal and reacted normally to light. Extraocular movements were full and conjugate.    V/VII-reduced perception of tactile sensation over right side of her face compared to left no facial weakness. VIII-normal. X-normal speech and symmetrical palatal movement. XI: trapezius strength/neck flexion strength normal bilaterally XII-midline tongue extension with normal strength. Motor: 5/5 bilaterally with normal tone and bulk Sensory: There is perception of tactile sensation over right extremities compared to left. Deep Tendon Reflexes: 1+ and symmetric. Plantars: Mute bilaterally Cerebellar: Normal finger-to-nose testing. Carotid auscultation: Normal  Results for orders placed or performed during the hospital encounter of 05/26/15 (from the past 48 hour(s))  MRSA PCR Screening     Status: None   Collection Time: 05/26/15  7:36 PM  Result Value Ref Range   MRSA by PCR NEGATIVE NEGATIVE    Comment:        The GeneXpert MRSA Assay (FDA approved for NASAL specimens only), is one component of a comprehensive MRSA colonization surveillance  program. It is not intended to diagnose MRSA infection nor to guide or monitor treatment for MRSA infections.    Dg Chest 2 View  05/25/2015  CLINICAL DATA:  Seizure at home, fell onto RIGHT shoulder, RIGHT shoulder pain EXAM: CHEST  2 VIEW COMPARISON:  07/21/2008 FINDINGS: Normal heart size, mediastinal contours, and pulmonary vascularity. Mild chronic bronchitic changes. Lungs otherwise clear. No pleural effusion or pneumothorax. No acute bony abnormalities. IMPRESSION: Mild chronic bronchitic changes without infiltrate or evidence of acute injury. Electronically Signed   By: Lavonia Dana M.D.   On: 05/25/2015  15:44   Dg Shoulder Right  05/25/2015  CLINICAL DATA:  Seizure, right shoulder pain EXAM: RIGHT SHOULDER - 2+ VIEW COMPARISON:  None. FINDINGS: Three views of the right shoulder submitted. No acute fracture or subluxation. There is high riding humeral head suspicious for rotator cuff injury. IMPRESSION: No displaced fracture or subluxation. High riding humeral head suspicious for rotator cuff injury Electronically Signed   By: Lahoma Crocker M.D.   On: 05/25/2015 15:41   Ct Head Wo Contrast  05/25/2015  CLINICAL DATA:  Seizure at home, RIGHT shoulder pain/injury, history of seizure disorder, throat cancer EXAM: CT HEAD WITHOUT CONTRAST TECHNIQUE: Contiguous axial images were obtained from the base of the skull through the vertex without intravenous contrast. COMPARISON:  03/28/2007 CT orbits FINDINGS: Normal ventricular morphology. No midline shift or mass effect. Normal appearance of brain parenchyma. No intracranial hemorrhage, mass lesion, or acute infarction. Minimal scattered mucosal thickening of the ethmoid air cells. Visualized paranasal sinuses and mastoid air cells otherwise clear. Bones unremarkable. IMPRESSION: No acute intracranial abnormalities. If patient has persistent or recurrent seizures, recommend followup MR imaging of the brain for further assessment. Electronically Signed   By: Lavonia Dana M.D.   On: 05/25/2015 15:31   Mr Brain Wo Contrast  05/25/2015  CLINICAL DATA:  Seizure at home and in the ambulance today. Initial encounter. EXAM: MRI HEAD WITHOUT CONTRAST TECHNIQUE: Multiplanar, multiecho pulse sequences of the brain and surrounding structures were obtained without intravenous contrast. COMPARISON:  CT head without contrast 05/25/2015 FINDINGS: No acute infarct, hemorrhage, or mass lesion is present. The ventricles are of normal size. No significant extraaxial fluid collection is present. Dedicated imaging of the temporal lobes demonstrate symmetric size and signal of the  hippocampal structures. The internal auditory canals are within normal limits bilaterally. Flow is present in the major intracranial arteries. The globes and orbits are intact. Mild mucosal thickening is scattered throughout the ethmoid air cells. The remaining paranasal sinuses are clear. Skullbase is within normal limits. Midline structures are unremarkable. IMPRESSION: Negative MRI of the brain. Electronically Signed   By: San Morelle M.D.   On: 05/25/2015 17:28   Mr Cervical Spine Wo Contrast  05/25/2015  CLINICAL DATA:  Seizure today. Right shoulder pain and injury. Right hand tremors. EXAM: MRI CERVICAL SPINE WITHOUT CONTRAST TECHNIQUE: Multiplanar, multisequence MR imaging of the cervical spine was performed. No intravenous contrast was administered. COMPARISON:  Cervical spine CT 09/19/2008 FINDINGS: Normal signal is present in the cervical and upper thoracic spinal cord to the lowest imaged level, T2-3. Marrow signal, vertebral body heights, and alignment are normal. The craniocervical junction is within normal limits. The visualized intracranial contents are normal. C2-3:  Negative. C3-4: A rightward disc osteophyte complex partially effaces the ventral CSF. Uncovertebral spurring contributes to mild right foraminal narrowing. C4-5: A broad-based disc osteophyte complex is present. Uncovertebral and facet disease is worse on the right. This contributes to  moderate right and mild left foraminal narrowing. C5-6: A broad-based disc osteophyte complex is present. Facet hypertrophy is worse on the right. Facet hypertrophy and uncovertebral spurring is worse on the right. Moderate right and mild left foraminal stenosis is present. C6-7: Asymmetric rightward uncovertebral spurring contributes to mild right foraminal stenosis. C7-T1:  Negative. IMPRESSION: 1. Multilevel spondylosis of the cervical spine is worse right than left. 2. Mild right central and foraminal stenosis at C3-4. 3. Moderate right  and mild left foraminal narrowing at C4-5 and C5-6. 4. Mild right foraminal narrowing at C6-7. Electronically Signed   By: San Morelle M.D.   On: 05/25/2015 17:33   Dg Pelvis Portable  05/26/2015  CLINICAL DATA:  Pain following seizure EXAM: PORTABLE PELVIS 1-2 VIEWS COMPARISON:  None. FINDINGS: There is no evidence of pelvic fracture or dislocation. Joint spaces appear intact. No erosive change. IMPRESSION: No demonstrable fracture or dislocation.  No apparent arthropathy. Electronically Signed   By: Lowella Grip III M.D.   On: 05/26/2015 21:52    Assessment/Plan 44 year old lady and tingling with recurrent episodes of seizure-like activity with no clear loss of consciousness and no postictal state. Psychophysiologic factors contributing to the patient's symptomatology are strongly suspected.  Recommendations: 1. Ativan 1 mg IV when necessary recurrent seizure-like spells 2. Long-term video EEG recording on 05/27/2015 3. No change in current dose of Neurontin 4. No clear indication for additional anticonvulsant medication at this point  We will continue to follow this patient with you.  C.R. Nicole Kindred, MD [facility] [phone]  05/26/2015, 10:39 PM
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
## 2015-05-27 ENCOUNTER — Inpatient Hospital Stay (HOSPITAL_COMMUNITY): Payer: Medicare Other

## 2015-05-27 DIAGNOSIS — G894 Chronic pain syndrome: Secondary | ICD-10-CM

## 2015-05-27 DIAGNOSIS — R569 Unspecified convulsions: Secondary | ICD-10-CM

## 2015-05-27 LAB — CBC WITH DIFFERENTIAL/PLATELET
Basophils Absolute: 0 10*3/uL (ref 0.0–0.1)
Basophils Relative: 0 %
EOS ABS: 0.1 10*3/uL (ref 0.0–0.7)
EOS PCT: 2 %
HCT: 35.2 % — ABNORMAL LOW (ref 36.0–46.0)
Hemoglobin: 11.3 g/dL — ABNORMAL LOW (ref 12.0–15.0)
LYMPHS ABS: 2.3 10*3/uL (ref 0.7–4.0)
LYMPHS PCT: 29 %
MCH: 28 pg (ref 26.0–34.0)
MCHC: 32.1 g/dL (ref 30.0–36.0)
MCV: 87.3 fL (ref 78.0–100.0)
MONO ABS: 0.5 10*3/uL (ref 0.1–1.0)
MONOS PCT: 6 %
Neutro Abs: 4.9 10*3/uL (ref 1.7–7.7)
Neutrophils Relative %: 63 %
PLATELETS: 253 10*3/uL (ref 150–400)
RBC: 4.03 MIL/uL (ref 3.87–5.11)
RDW: 12.7 % (ref 11.5–15.5)
WBC: 7.8 10*3/uL (ref 4.0–10.5)

## 2015-05-27 LAB — COMPREHENSIVE METABOLIC PANEL
ALBUMIN: 3 g/dL — AB (ref 3.5–5.0)
ALT: 11 U/L — AB (ref 14–54)
AST: 13 U/L — AB (ref 15–41)
Alkaline Phosphatase: 29 U/L — ABNORMAL LOW (ref 38–126)
Anion gap: 5 (ref 5–15)
CHLORIDE: 109 mmol/L (ref 101–111)
CO2: 24 mmol/L (ref 22–32)
CREATININE: 0.59 mg/dL (ref 0.44–1.00)
Calcium: 8.1 mg/dL — ABNORMAL LOW (ref 8.9–10.3)
GFR calc Af Amer: >60 mL/min (ref >60)
GLUCOSE: 89 mg/dL (ref 65–99)
POTASSIUM: 3.5 mmol/L (ref 3.5–5.1)
SODIUM: 138 mmol/L (ref 135–145)
Total Bilirubin: 0.7 mg/dL (ref 0.3–1.2)
Total Protein: 5.3 g/dL — ABNORMAL LOW (ref 6.5–8.1)

## 2015-05-27 LAB — RAPID URINE DRUG SCREEN, HOSP PERFORMED
Amphetamines: NOT DETECTED
Barbiturates: NOT DETECTED
Benzodiazepines: POSITIVE — AB
Cocaine: NOT DETECTED
OPIATES: POSITIVE — AB
TETRAHYDROCANNABINOL: POSITIVE — AB

## 2015-05-27 LAB — PREGNANCY, URINE: PREG TEST UR: NEGATIVE

## 2015-05-27 MED ORDER — OXYCODONE HCL 5 MG PO TABS
5.0000 mg | ORAL_TABLET | Freq: Three times a day (TID) | ORAL | Status: DC | PRN
  Administered 2015-05-28 – 2015-06-01 (×10): 5 mg via ORAL
  Filled 2015-05-27 (×10): qty 1

## 2015-05-27 MED ORDER — MORPHINE SULFATE (PF) 2 MG/ML IV SOLN
2.0000 mg | INTRAVENOUS | Status: DC | PRN
  Administered 2015-05-27 – 2015-05-30 (×10): 2 mg via INTRAVENOUS
  Filled 2015-05-27 (×10): qty 1

## 2015-05-27 NOTE — Progress Notes (Signed)
vLTM EEG recording. No skin breakdown with hookup. Tested event button.Educated Marine scientist. Informed reading physician

## 2015-05-27 NOTE — Progress Notes (Signed)
05/27/2015  Patient had seizure at 1858 and it lasted 4 mins tremors all over. Patient is awake, but not able to answer question right now. [facility] RN.
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
## 2015-05-27 NOTE — Progress Notes (Signed)
05/27/2015 patient had a clonic/tonic seizure at 1814, had tremors all over and it lasted 3 mins. Patient not aware of seizure activity. When name called not responding. [facility] RN.

## 2015-05-27 NOTE — Progress Notes (Signed)
05/27/2015 patient had a clonic/tonic seizure at 1015 and it lasted 4 mins.  Patient was not aware of her surroundings. Patient had temors upper extremities. Dr Algis Liming and Dr Armida Sans  was made aware. [facility] RN.

## 2015-05-27 NOTE — Progress Notes (Signed)
05/26/2015 Patient had seizure at 1334 clonic/tonic upper extremity shaking. Lasted 5 mins. Patient was unaware of surrounding. Nurse had to reassess patient level of consciousness.  [facility] RN.

## 2015-05-27 NOTE — Progress Notes (Signed)
05/27/2015 Patient had a seizure at 1445, moving upper extremity it lasted 42mns. Dr CArmida Sans (neurologist) [facility] made aware. [facility]

## 2015-05-27 NOTE — Progress Notes (Signed)
05/27/2015 Patient had clonic/tonic seizure at 0735. Rn from third shift and first shift nurse witness  seizure activity. Dr Laurice Record was made aware. Dr Armida Sans (neurology) text via [place]. [facility] RN.

## 2015-05-27 NOTE — Progress Notes (Signed)
Utilization Review Completed.Donne Anon T12/15/2016

## 2015-05-27 NOTE — Progress Notes (Addendum)
05/27/2015 bladder scan was done at 1745 she had >703 in bladder. In and out cath at 1800 was 925cc in bladder.[facility] RN.
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
## 2015-05-27 NOTE — Progress Notes (Signed)
05/27/2015 bladder scan at 0900 was >800.  Order was given for in and out cath patient had 1500cc of yellow cloudy urine. [facility] RN.
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
## 2015-05-28 LAB — CBC
HCT: 34.6 % — ABNORMAL LOW (ref 36.0–46.0)
Hemoglobin: 11.6 g/dL — ABNORMAL LOW (ref 12.0–15.0)
MCH: 29 pg (ref 26.0–34.0)
MCHC: 33.5 g/dL (ref 30.0–36.0)
MCV: 86.5 fL (ref 78.0–100.0)
Platelets: 237 10*3/uL (ref 150–400)
RBC: 4 MIL/uL (ref 3.87–5.11)
RDW: 12.6 % (ref 11.5–15.5)
WBC: 7.1 10*3/uL (ref 4.0–10.5)

## 2015-05-28 NOTE — Progress Notes (Signed)
Pt had some seizure-like activities that lasted for about 5 min. Immediately following the episode, she was ALERT and able to state her name, her current location, and how many dogs she has (10: she told me this earlier). She followed command appropriately. Husband is present at bedside at this moment. Will continue to monitor.

## 2015-05-28 NOTE — Progress Notes (Signed)
The patient experienced 2 back to back seizures at [place]. The first was ~47mn and she began to become more alert and was nodding appropriately in response to questions when the second event began.  The second event began at 04  At 0029 153mAtivan was administered IV.  This 2nd event lasted ~3 minutes. For the first event the patient was unconscious and exhibiting tonic/clonic movement in the upper extremities that progressed to include the BLE. No N/V, no urinary incontinence, and VS remained stable.   For the second event the patient was again unconscious however tonic/clonic movement was limited to the BUE, urinary incontinence was present, VS continued to remain stable.  At 0051, the patient was slow to respond but was giving appropriate answers and was A+O x3.  Dr. StNicole Kindredrom neurology was notified.  24hr EEG monitoring equipment was present throughout these events.  Will continue to monitor.

## 2015-05-28 NOTE — Progress Notes (Signed)
PROGRESS NOTE    Jill Santiago BZJ:[id number] DOB: 04/11/1971 DOA: 05/26/2015 PCP: [facility]/Dr. Dema Severin Patient does not follow with a neurologist.  HPI/Brief narrative 44 year old female patient with prolonged reported history of seizures, has been off of antiepileptic medications (phenobarbitone and Dilantin for >6 years), currently on high-dose Neurontin which was probably started for chronic pain, does not drive >1 year, chronic low back pain, COPD, iron deficiency anemia, history of sporadic seizures with no clear-cut pattern, transferred from [facility] with history of frequent seizure like episodes 3 days, for continuous EEG monitoring and neurology input. Seizures are reported as generalized tonic-clonic, altered/LOC at times, some episodes associated with urinary incontinence and tongue biting with postictal phase at times. Claims to have chronic right lower extremity weakness. MRI brain and C-spine at [facility] apparently unremarkable. X-ray right shoulder at [facility] concerning for possible right shoulder rotator cuff tear.   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 - CT head and MRI brain without acute findings. MRI C-spine with chronic findings but no acute findings.  - Neurology consultation and follow-up appreciated. - She has had several atypical tonic-clonic seizure like activity since admission to stepdown unit. - She completed a 24-hour continuous video EEG monitoring this morning. As per discussion with neurology, Seizure like activity without concomitant EEG correlate. 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 Neurology recommends psychiatric consultation - called 12/16. - Continue current dose of Neurontin. - Tricyclics decrease seizure threshold and may have to consider reducing dose.  Acute  urinary retention - Noted 12/15 a.m. Seems to have prior urinary issues (on enablex). In and out cath when necessary and monitor. Patient follows with urology at [facility].  Right shoulder pain with possible rotator cuff tear - Seen on x-ray. Patient complains of some pain in the right shoulder, worse with movement. Discussed with on call orthopedics on 12/15 Merla Riches, [place] with Dr. Veverly Fells) who recommended MRI of the right shoulder and may follow up as outpatient. Discussed with patient and her husband and explained that since she is having these movement disorder at this time, may be reasonable to hold off on MRI of the shoulder, treat symptomatically and can follow outpatient with orthopedics and they were agreeable.  Chronic pain - Continue home regimen. Follows with pain management at [facility].  COPD - Stable.  Iron deficiency anemia - Stable.  Reported history of renal cell carcinoma and previous neck mass - Outpatient follow-up. No neck mass seen on MRI of cervical spine.  ? Substance abuse - UDS positive for cannabinoid, tricyclic, opiate and benzodiazepine. Some of these may be her prescription meds. - Patient states that she does not abuse drugs but spouse indicates that he smokes marijuana and maybe that's why her urine drug screen is positive.    DVT prophylaxis: SCDs  Code Status: Full  Family Communication: Discussed with spouse/boyfriend at bedside on 12/16. Disposition Plan: DC home when medically stable. Continue management in stepdown unit at this time.    Consultants:  Neurology  Psychiatry-pending  Procedures:  Continuous video EEG monitoring   Antibiotics:  None    Subjective: Multiple episodes of atypical generalized tonic-clonic seizure activity since admission to stepdown unit. Chronic right hip pain. Right shoulder pain, new and worsening with movement-mild to moderate.  Objective: Filed Vitals:   05/27/15 2024 05/27/15 2045  05/28/15 0000 05/28/15 0400  BP:  132/96 117/78 115/84  Pulse:  91 78 78  Temp:   98.2 F (36.8 C) 98.5 F (36.9 C)  TempSrc:   Oral Oral  Resp:  13 13 12   Height:      Weight:      SpO2: 98% 95% 96% 96%    Intake/Output Summary (Last 24 hours) at 05/28/15 0716 Last data filed at 05/27/15 2235  Gross per 24 hour  Intake 2190.83 ml  Output   3200 ml  Net -1009.17 ml   Filed Weights   05/26/15 1840 05/26/15 2100  Weight: 66.8 kg (147 lb 4.3 oz) 66.2 kg (145 lb 15.1 oz)     Exam:  General exam: Pleasant young female lying comfortably in bed.  Respiratory system: Clear. No increased work of breathing. Cardiovascular system: S1 & S2 heard, RRR. No JVD, murmurs, gallops, clicks or pedal edema. Telemetry: Sinus rhythm. Some artifacts related to movements. Gastrointestinal system: Abdomen is nondistended, soft and nontender. Normal bowel sounds heard. Central nervous system: Alert and oriented. No focal neurological deficits. Extremities: Symmetric 5 x 5 power except right lower extremity where? 4 x 5 power-seems to be providing inconsistent effort. No acute findings on right shoulder exam except some restricted movement secondary to pain on abduction and elevation above shoulder level.   Data Reviewed: Basic Metabolic Panel:  Recent Labs Lab 05/25/15 1439 05/27/15 0254  NA 137 138  K 4.1 3.5  CL 101 109  CO2 24 24  GLUCOSE 99 89  BUN 7 <5*  CREATININE 0.65 0.59  CALCIUM 8.9 8.1*   Liver Function Tests:  Recent Labs Lab 05/25/15 1439 05/27/15 0254  AST 17 13*  ALT 10* 11*  ALKPHOS 38 29*  BILITOT 0.6 0.7  PROT 7.2 5.3*  ALBUMIN 4.4 3.0*    Recent Labs Lab 05/25/15 1439  LIPASE 18   No results for input(s): AMMONIA in the last 168 hours. CBC:  Recent Labs Lab 05/25/15 1439 05/27/15 0254 05/28/15 0306  WBC 9.2 7.8 7.1  NEUTROABS  --  4.9  --   HGB 13.3 11.3* 11.6*  HCT 41.2 35.2* 34.6*  MCV 86.0 87.3 86.5  PLT 279 253 237   Cardiac  Enzymes: No results for input(s): CKTOTAL, CKMB, CKMBINDEX, TROPONINI in the last 168 hours. BNP (last 3 results) No results for input(s): PROBNP in the last 8760 hours. CBG:  Recent Labs Lab 05/25/15 1446 05/26/15 0924 05/26/15 1448 05/26/15 1614  GLUCAP 93 85 104* 83    Recent Results (from the past 240 hour(s))  MRSA PCR Screening     Status: None   Collection Time: 05/26/15  9:29 AM  Result Value Ref Range Status   MRSA by PCR NEGATIVE NEGATIVE Final    Comment:        The GeneXpert MRSA Assay (FDA approved for NASAL specimens only), is one component of a comprehensive MRSA colonization surveillance program. It is not intended to diagnose MRSA infection nor to guide or monitor treatment for MRSA infections.   MRSA PCR Screening     Status: None   Collection Time: 05/26/15  7:36 PM  Result Value Ref Range Status   MRSA by PCR NEGATIVE NEGATIVE Final    Comment:        The GeneXpert MRSA Assay (FDA approved for NASAL specimens only), is one component of a comprehensive MRSA colonization surveillance program. It is not intended to diagnose MRSA infection nor to guide or monitor treatment for MRSA infections.  Studies: Dg Pelvis Portable  05/26/2015  CLINICAL DATA:  Pain following seizure EXAM: PORTABLE PELVIS 1-2 VIEWS COMPARISON:  None. FINDINGS: There is no evidence of pelvic fracture or dislocation. Joint spaces appear intact. No erosive change. IMPRESSION: No demonstrable fracture or dislocation.  No apparent arthropathy. Electronically Signed   By: Lowella Grip III M.D.   On: 05/26/2015 21:52        Scheduled Meds: . cyclobenzaprine  10 mg Oral TID  . darifenacin  7.5 mg Oral Daily  . ferrous sulfate  325 mg Oral TID  . gabapentin  900 mg Oral BID WC  . gabapentin  1,200 mg Oral QHS  . glycopyrrolate  1 mg Oral QPM  . mometasone-formoterol  2 puff Inhalation BID  . montelukast  10 mg Oral QHS  . morphine  15 mg Oral 3 times per  day  . multivitamin with minerals  1 tablet Oral Daily  . nortriptyline  75 mg Oral QHS  . tiotropium  18 mcg Inhalation Daily  . Vitamin D (Ergocalciferol)  50,000 Units Oral Q7 days   Continuous Infusions:    Principal Problem:   Seizures (HCC) Active Problems:   Chronic pain syndrome   Seizure (HCC)   Seizure-like activity (HCC)    Time spent: 35 minutes.    Vernell Leep, MD, FACP, FHM. [facility] Pager [phone]  If 7PM-7AM, please contact night-coverage [email or web address] Password [email or web address] 05/28/2015, 7:16 AM    LOS: 2 days

## 2015-05-28 NOTE — Progress Notes (Addendum)
NEURO HOSPITALIST PROGRESS NOTE   SUBJECTIVE:                                                                                                                        Complains of constant numbness in her genitalia and left side. Had had at least 4 or 5 of her habitual seizures without concomitant EEG correlate. Interictal EEG is normal. Serologies are unremarkable. Had MRI brain and cervical done 12/13 that showed no acute abnormalities or other lesions to explain patient presentation.   OBJECTIVE:                                                                                                                           Vital signs in last 24 hours: Temp:  [98 F (36.7 C)-98.5 F (36.9 C)] 98 F (36.7 C) (12/16 0832) Pulse Rate:  [77-100] 90 (12/16 0832) Resp:  [9-16] 12 (12/16 0832) BP: (114-132)/(78-108) 123/90 mmHg (12/16 0832) SpO2:  [95 %-100 %] 99 % (12/16 0832)  Intake/Output from previous day: 12/15 0701 - 12/16 0700 In: 2190.8 [P.O.:240; I.V.:1950.8] Out: 3200 [Urine:3200] Intake/Output this shift: Total I/O In: 60 [P.O.:60] Out: -  Nutritional status: DIET SOFT Room service appropriate?: Yes; Fluid consistency:: Thin  Past Medical History  Diagnosis Date  . Seizures ([place])   . FH: kidney cancer   . Throat cancer ([place])   . Iron deficiency anemia   . Hemorrhoids   . Chronic low back pain   . Urinary incontinence   . Sacroiliac joint dysfunction   . Ectopic pregnancy   . COPD (chronic obstructive pulmonary disease) (HCC)    Physical Examination: HEENT- Normocephalic, no lesions, without obvious abnormality. Normal external eye and conjunctiva. Normal TM's bilaterally. Normal auditory canals and external ears. Normal external nose, mucus membranes and septum. Normal pharynx. Neck supple with no masses, nodes, nodules or enlargement. Cardiovascular - regular rate and rhythm, S1, S2 normal, no murmur, click, rub or  gallop Lungs - chest clear, no wheezing, rales, normal symmetric air entry Abdomen - soft, non-tender; bowel sounds normal; no masses, no organomegaly Extremities - no joint deformities, effusion, or inflammation and no edema  Neurologic Exam:  Mental Status: Alert, oriented, flat, depressed affect. Speech fluent without evidence  of aphasia. Able to follow commands without difficulty. Cranial Nerves: II-Visual fields were normal. III/IV/VI-Pupils were equal and reacted normally to light. Extraocular movements were full and conjugate.  V/VII-reduced perception of tactile sensation over right side of her face compared to left no facial weakness. VIII-normal. X-normal speech and symmetrical palatal movement. XI: trapezius strength/neck flexion strength normal bilaterally XII-midline tongue extension with normal strength. Motor: 5/5 bilaterally with normal tone and bulk Sensory: There is perception of tactile sensation over right extremities compared to left. Deep Tendon Reflexes: 1+ and symmetric. Plantars: Mute bilaterally Cerebellar: Normal finger-to-nose testing   Lab Results: No results found for: CHOL Lipid Panel No results for input(s): CHOL, TRIG, HDL, CHOLHDL, VLDL, LDLCALC in the last 72 hours.  Studies/Results: Dg Pelvis Portable  05/26/2015  CLINICAL DATA:  Pain following seizure EXAM: PORTABLE PELVIS 1-2 VIEWS COMPARISON:  None. FINDINGS: There is no evidence of pelvic fracture or dislocation. Joint spaces appear intact. No erosive change. IMPRESSION: No demonstrable fracture or dislocation.  No apparent arthropathy. Electronically Signed   By: Lowella Grip III M.D.   On: 05/26/2015 21:52    MEDICATIONS                                                                                                                        Scheduled: . cyclobenzaprine  10 mg Oral TID  . darifenacin  7.5 mg Oral Daily  . ferrous sulfate  325 mg Oral TID  . gabapentin  900 mg  Oral BID WC  . gabapentin  1,200 mg Oral QHS  . glycopyrrolate  1 mg Oral QPM  . mometasone-formoterol  2 puff Inhalation BID  . montelukast  10 mg Oral QHS  . morphine  15 mg Oral 3 times per day  . multivitamin with minerals  1 tablet Oral Daily  . nortriptyline  75 mg Oral QHS  . tiotropium  18 mcg Inhalation Daily  . Vitamin D (Ergocalciferol)  50,000 Units Oral Q7 days    ASSESSMENT/PLAN:                                                                                                           44 y/o lady transferred from [place] for further evaluation of daily epileptic seizures versus PNES. Had had 4 or 5 of her habitual events during this hospitalization which I personally reviewed and showed no associated EEG correlate. Will wait for the final report from [facility] epileptologist and then informed patient and husband about results of video-EEG monitoring and what to do  next. D/C LTM. Will follow up.    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 I informed patient of results and recommended a psych consultation. We had an ample conversation regarding the potential etiology, management, and prognosis of PNES and I stressed the fact that this type of seizures don't require using antiseizure medications. Recommended a psych consultation and she agreed. In fact, she admits to having " a traumatic childhood" and significant stress in her life. 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 She was in tears saying that she was very happy to know such a good news. Neurology will sign off.  Dorian Pod, MD

## 2015-05-28 NOTE — Progress Notes (Signed)
vLTM EEG complete. No skin breakdown. Event button pushed/ notified reading physician/ results pending

## 2015-05-28 NOTE — Procedures (Signed)
Continuous 23 hour Video EEG Monitoring  Monitoring ran from May 27, 2015 to May 28, 2015.  Requesting Physician: Dr. Armida Sans, M.D.  Reading Physician: Dr. Judeth Cornfield. Paulla Fore, M.D.  HPI: This is a 44 year old white female with a history of seizures since age 44 who  presents with recurring episodes of generalized shaking and reduced responsiveness.  She had multiple episodes witnessed today.  She has been on Neurontin for seizure control.  Medications: Gabapentin, Lorazepam, Remeron and Oxycodone.  Technique: This was a continuous 19 channel 23 hour video EEG monitoring with 18 channels of EEG as well as one channel of EKG.  It was performed during a state of wakefulness and sleep. Neither photic stimulation nor hyperventilation were performed as activating procedures.  Description: As the tracing opens, the dominant posterior waking rhythm consists of a frequency of 11-12 Hz alpha with a voltage range of 5 to 20 microvolts.  As the recording continues, the patient became drowsy and did enter  stage II sleep as demonstrated by the presence of symmetric vertex waves, K complexes and sleep spindles.  Event #1 occurred at 10:12:40 a.m. and consisted of some trembling and shaking in both arms and legs with the  arms being across the chest.  This was associated only with tremble  artifact.  Event #2 occurred at 1:28:39 p.m. with some trembling of the right hand with no definitive EEG correlate noted.  Event #3 occurred at 1:38:40 3 pm  with some generalized trembling and shaking in both arms and legs.  There was no EEG correlate noted.  Event #4 occurred at 6:13:13 p.m. with some whole body shaking and trembling noted.  This was associated only with some trembling artifact.  Event #5 occurred at 6:58:13 p.m. with some whole body shaking and trembling.  This was associated only with some trembling artifact.  Event #6 occurred at 12:08:0 3 AM and was associated with some generalized shaking and  trembling. This  was also only associated with some trembling artifact.  Event #7 occurred at 12:27:20 3 am with   some generalized shaking of the arms and flapping of the arms noted.  This was  also associated only with some trembling artifact.  There were no asymmetries nor epileptiform discharges noted during the recording.  There are no electrographic seizures noted.  Electrodiagnostic Diagnosis: This was a normal awake and asleep continuous 23 hour video EEG monitoring.  There were 6 push button events recorded consisting  of some generalized shaking and trembling.  Clinical Correlation: The six  observed pushbutton events were all felt to be nonepileptic in nature.  There were no electrographic seizures nor asymmetries noted.     Consuello Bossier, M.D.

## 2015-05-29 DIAGNOSIS — F4310 Post-traumatic stress disorder, unspecified: Secondary | ICD-10-CM | POA: Diagnosis present

## 2015-05-29 MED ORDER — PRAZOSIN HCL 2 MG PO CAPS
2.0000 mg | ORAL_CAPSULE | Freq: Every day | ORAL | Status: DC
  Administered 2015-05-29 – 2015-05-31 (×3): 2 mg via ORAL
  Filled 2015-05-29 (×4): qty 1

## 2015-05-29 MED ORDER — SERTRALINE HCL 50 MG PO TABS
50.0000 mg | ORAL_TABLET | Freq: Every day | ORAL | Status: DC
  Administered 2015-05-29 – 2015-06-01 (×4): 50 mg via ORAL
  Filled 2015-05-29 (×4): qty 1

## 2015-05-29 NOTE — Progress Notes (Addendum)
PROGRESS NOTE    Jill Santiago MHD:[id number] DOB: March 08, 1971 DOA: 05/26/2015 PCP: [facility]/Dr. Dema Severin Patient does not follow with a neurologist.  HPI/Brief narrative 44 year old female patient with prolonged reported history of seizures, has been off of antiepileptic medications (phenobarbitone and Dilantin for >6 years), currently on high-dose Neurontin which was probably started for chronic pain, does not drive >1 year, chronic low back pain, COPD, iron deficiency anemia, history of sporadic seizures with no clear-cut pattern, transferred from [facility] with history of frequent seizure like episodes 3 days, for continuous EEG monitoring and neurology input. Seizures are reported as generalized tonic-clonic, altered/LOC at times, some episodes associated with urinary incontinence and tongue biting with postictal phase at times. Claims to have chronic right lower extremity weakness. MRI brain and C-spine at [facility] apparently unremarkable. X-ray right shoulder at [facility] concerning for possible right shoulder rotator cuff tear. Neurology has evaluated. Patient completed a 24-hour video EEG monitoring. 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 Psychiatry consulted 12/16-input pending.   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 - CT head and MRI brain without acute findings. MRI C-spine with chronic findings but no acute findings.  - Neurology consultation and follow-up appreciated. - She has had several atypical tonic-clonic seizure like activity since admission to stepdown unit. - She completed a 24-hour continuous video EEG monitoring. As per discussion with neurology, Seizure like activity without concomitant EEG correlate. 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 Neurology recommends psychiatric consultation -  called 12/16. Neurology recommends discontinuing when necessary Ativan.  - Continue current dose of Neurontin. - Tricyclics decrease seizure threshold and may have to consider reducing dose.  Acute urinary retention - Noted 12/15 a.m. Seems to have prior urinary issues (on enablex). In and out cath when necessary and monitor. Patient follows with urology at [facility]. - Foley catheter was placed on 12/16. DC Foley catheter 12/17 and voiding trial.  Right shoulder pain with possible rotator cuff tear - Seen on x-ray. Patient complained of some pain in the right shoulder, worse with movement on 12/16 . Discussed with on call orthopedics on 12/15 Merla Riches, [place] with Dr. Veverly Fells) who recommended MRI of the right shoulder and may follow up as outpatient. Discussed with patient and her husband and explained that since she is having these movement disorder at this time, may be reasonable to hold off on MRI of the shoulder, treat symptomatically and can follow outpatient with orthopedics and they were agreeable. - Pain seems to have improved. Outpatient follow-up with orthopedics.   Chronic pain - Continue home regimen. Follows with pain management at [facility]. Jjudicious use of opioids.   COPD - Stable.  Iron deficiency anemia - Stable.  Reported history of renal cell carcinoma and previous neck mass - Outpatient follow-up. No neck mass seen on MRI of cervical spine.  ? Substance abuse - UDS positive for cannabinoid, tricyclic, opiate and benzodiazepine. Some of these may be her prescription meds. - Patient states that she does not abuse drugs but spouse indicates that he smokes marijuana and maybe that's why her urine drug screen is positive.    DVT prophylaxis: SCDs  Code Status: Full  Family Communication: none at bedside today.  Disposition Plan: pending psychiatry input.     Consultants:  Neurology  Psychiatry-pending  Procedures:  Continuous video EEG  monitoring: Electrodiagnostic Diagnosis: This was a normal  awake and asleep continuous 23 hour video EEG monitoring. There were 6 push button events recorded consisting of some generalized shaking and trembling.  Clinical Correlation: The six observed pushbutton events were all felt to be nonepileptic in nature. There were no electrographic seizures nor asymmetries noted.    Antibiotics:  None    Subjective: Multiple episodes of atypical generalized tonic-clonic seizure activity since admission to stepdown unit - 2 episodes documented last night. No pain issues reported today. No acute issues per RN.   Objective: Filed Vitals:   05/29/15 0600 05/29/15 0800 05/29/15 0811 05/29/15 0900  BP: 103/73 114/66  131/92  Pulse: 75 91  90  Temp:  98.4 F (36.9 C)    TempSrc:  Oral    Resp: 9 11  14   Height:      Weight:      SpO2: 96% 98% 100% 98%    Intake/Output Summary (Last 24 hours) at 05/29/15 1324 Last data filed at 05/29/15 7408  Gross per 24 hour  Intake    480 ml  Output   2700 ml  Net  -2220 ml   Filed Weights   05/26/15 1840 05/26/15 2100  Weight: 66.8 kg (147 lb 4.3 oz) 66.2 kg (145 lb 15.1 oz)     Exam:  General exam: Pleasant young female lying comfortably in bed.  Respiratory system: Clear. No increased work of breathing. Cardiovascular system: S1 & S2 heard, RRR. No JVD, murmurs, gallops, clicks or pedal edema. Telemetry: Sinus rhythm. Some artifacts related to movements. Gastrointestinal system: Abdomen is nondistended, soft and nontender. Normal bowel sounds heard. Central nervous system: Alert and oriented. No focal neurological deficits. Extremities: Symmetric 5 x 5 power except right lower extremity where? 4 x 5 power-seems to be providing inconsistent effort. No acute findings on right shoulder exam except some restricted movement secondary to pain on abduction and elevation above shoulder level.   Data Reviewed: Basic Metabolic Panel:  Recent Labs Lab  05/25/15 1439 05/27/15 0254  NA 137 138  K 4.1 3.5  CL 101 109  CO2 24 24  GLUCOSE 99 89  BUN 7 <5*  CREATININE 0.65 0.59  CALCIUM 8.9 8.1*   Liver Function Tests:  Recent Labs Lab 05/25/15 1439 05/27/15 0254  AST 17 13*  ALT 10* 11*  ALKPHOS 38 29*  BILITOT 0.6 0.7  PROT 7.2 5.3*  ALBUMIN 4.4 3.0*    Recent Labs Lab 05/25/15 1439  LIPASE 18   No results for input(s): AMMONIA in the last 168 hours. CBC:  Recent Labs Lab 05/25/15 1439 05/27/15 0254 05/28/15 0306  WBC 9.2 7.8 7.1  NEUTROABS  --  4.9  --   HGB 13.3 11.3* 11.6*  HCT 41.2 35.2* 34.6*  MCV 86.0 87.3 86.5  PLT 279 253 237   Cardiac Enzymes: No results for input(s): CKTOTAL, CKMB, CKMBINDEX, TROPONINI in the last 168 hours. BNP (last 3 results) No results for input(s): PROBNP in the last 8760 hours. CBG:  Recent Labs Lab 05/25/15 1446 05/26/15 0924 05/26/15 1448 05/26/15 1614  GLUCAP 93 85 104* 83    Recent Results (from the past 240 hour(s))  MRSA PCR Screening     Status: None   Collection Time: 05/26/15  9:29 AM  Result Value Ref Range Status   MRSA by PCR NEGATIVE NEGATIVE Final    Comment:        The GeneXpert MRSA Assay (FDA approved for NASAL specimens only), is one component of a comprehensive MRSA colonization  surveillance program. It is not intended to diagnose MRSA infection nor to guide or monitor treatment for MRSA infections.   MRSA PCR Screening     Status: None   Collection Time: 05/26/15  7:36 PM  Result Value Ref Range Status   MRSA by PCR NEGATIVE NEGATIVE Final    Comment:        The GeneXpert MRSA Assay (FDA approved for NASAL specimens only), is one component of a comprehensive MRSA colonization surveillance program. It is not intended to diagnose MRSA infection nor to guide or monitor treatment for MRSA infections.          Studies: No results found.      Scheduled Meds: . cyclobenzaprine  10 mg Oral TID  . darifenacin  7.5 mg  Oral Daily  . ferrous sulfate  325 mg Oral TID  . gabapentin  900 mg Oral BID WC  . gabapentin  1,200 mg Oral QHS  . glycopyrrolate  1 mg Oral QPM  . mometasone-formoterol  2 puff Inhalation BID  . montelukast  10 mg Oral QHS  . morphine  15 mg Oral 3 times per day  . multivitamin with minerals  1 tablet Oral Daily  . nortriptyline  75 mg Oral QHS  . tiotropium  18 mcg Inhalation Daily  . Vitamin D (Ergocalciferol)  50,000 Units Oral Q7 days   Continuous Infusions:    Principal Problem:   Seizures (HCC) Active Problems:   Chronic pain syndrome   Seizure (HCC)   Seizure-like activity (HCC)    Time spent: 15 minutes.    Vernell Leep, MD, FACP, FHM. [facility] Pager [phone]  If 7PM-7AM, please contact night-coverage [email or web address] Password [email or web address] 05/29/2015, 1:24 PM    LOS: 3 days
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
## 2015-05-29 NOTE — Progress Notes (Signed)
Patient had a seizure episode from 1157-1203 with urine incontinence.Ativan given per PRN orders.

## 2015-05-29 NOTE — Progress Notes (Signed)
Pt had a seizure beginning at 2325 until 2330.  47m of Ativan was given. "postictal" pt was very lethargic, difficulty speaking, drowsy, and complaining of pain - requesting pain medication. RN will continue to monitor.

## 2015-05-30 ENCOUNTER — Inpatient Hospital Stay (HOSPITAL_COMMUNITY): Payer: Medicare Other

## 2015-05-30 DIAGNOSIS — R338 Other retention of urine: Secondary | ICD-10-CM | POA: Insufficient documentation

## 2015-05-30 MED ORDER — GADOBENATE DIMEGLUMINE 529 MG/ML IV SOLN
14.0000 mL | Freq: Once | INTRAVENOUS | Status: AC | PRN
  Administered 2015-05-30: 14 mL via INTRAVENOUS

## 2015-05-30 MED ORDER — DEXTROSE 5 % IV SOLN
1.0000 g | INTRAVENOUS | Status: DC
  Administered 2015-05-30 – 2015-06-01 (×3): 1 g via INTRAVENOUS
  Filled 2015-05-30 (×3): qty 10

## 2015-05-30 MED ORDER — MORPHINE SULFATE (PF) 2 MG/ML IV SOLN
1.0000 mg | INTRAVENOUS | Status: DC | PRN
  Administered 2015-05-30 – 2015-06-01 (×10): 1 mg via INTRAVENOUS
  Filled 2015-05-30 (×10): qty 1

## 2015-05-30 NOTE — Progress Notes (Addendum)
NEURO HOSPITALIST PROGRESS NOTE   SUBJECTIVE:                                                                                                                        Called back by medicine attending Dr Algis Liming regarding patient complains of right foot/leg numbness-weakness. Patient indicated that since she came to [facility] with her seizures she has been having " a new numb sensation that goes from my right knee down to the foot". Further, she said that concomitantly with this she has no sensation in her groin and genitalia area, can not empty completely her bladder (althoguh stresses that had had trouble with  Her bladder before " but not like this), and that can not even feel when they inserted the Foley catheter. No bowel movements since admission. Patient said that from time to time her right foot gets weak and that she had a neuroconduction study in [place] that showed evidence of neuropathy. Chronic back pain results from " a pinch nerve in my lower back, I had a discectomy couple years ago".   OBJECTIVE:                                                                                                                           Vital signs in last 24 hours: Temp:  [97.5 F (36.4 C)-98.4 F (36.9 C)] 97.9 F (36.6 C) (12/18 0800) Pulse Rate:  [69-107] 88 (12/18 0900) Resp:  [7-16] 8 (12/18 0900) BP: (89-152)/(54-131) 109/84 mmHg (12/18 0900) SpO2:  [93 %-99 %] 95 % (12/18 0900)  Intake/Output from previous day: 12/17 0701 - 12/18 0700 In: -  Out: 1950 [Urine:1950] Intake/Output this shift:   Nutritional status: DIET SOFT Room service appropriate?: Yes; Fluid consistency:: Thin  Past Medical History  Diagnosis Date  . Seizures ([place])   . FH: kidney cancer   . Throat cancer ([place])   . Iron deficiency anemia   . Hemorrhoids   . Chronic low back pain   . Urinary incontinence   . Sacroiliac joint dysfunction   . Ectopic pregnancy   . COPD (chronic  obstructive pulmonary disease) (HCC)     Physical Examination: HEENT- Normocephalic, no lesions, without obvious abnormality. Normal external eye and conjunctiva. Normal  TM's bilaterally. Normal auditory canals and external ears. Normal external nose, mucus membranes and septum. Normal pharynx. Neck supple with no masses, nodes, nodules or enlargement. Cardiovascular - regular rate and rhythm, S1, S2 normal, no murmur, click, rub or gallop Lungs - chest clear, no wheezing, rales, normal symmetric air entry Abdomen - soft, non-tender; bowel sounds normal; no masses, no organomegaly Extremities - no joint deformities, effusion, or inflammation and no edema  Neurologic Exam:  Mental Status: Alert, oriented, flat, depressed affect. Speech fluent without evidence of aphasia. Able to follow commands without difficulty. Cranial Nerves: II-Visual fields were normal. III/IV/VI-Pupils were equal and reacted normally to light. Extraocular movements were full and conjugate.  V/VII-reduced perception of tactile sensation over right side of her face compared to left no facial weakness. VIII-normal. X-normal speech and symmetrical palatal movement. XI: trapezius strength/neck flexion strength normal bilaterally XII-midline tongue extension with normal strength. Motor: there is definite giveaway weakness of the upper extremities and strongly believe she is not giving me full effort on testing of motor strength lower extremities where she has 4/5 weakness hip flexors and knee extensors in the right and 2/5 weakness right foot dorsiflexors, foot invertors and evertors. Sensory: I did not exam rectal tone or exam of her genitalia as nurse was not available to supervise such genital exam. Decreased pinprick from the right knee down. Deep Tendon Reflexes: 2+ and symmetric. Plantars: Mute bilaterally Cerebellar: Normal finger-to-nose testing  Lab Results: No results found for: CHOL Lipid Panel No  results for input(s): CHOL, TRIG, HDL, CHOLHDL, VLDL, LDLCALC in the last 72 hours.  Studies/Results: No results found.  MEDICATIONS                                                                                                                        Scheduled: . cyclobenzaprine  10 mg Oral TID  . ferrous sulfate  325 mg Oral TID  . gabapentin  900 mg Oral BID WC  . gabapentin  1,200 mg Oral QHS  . glycopyrrolate  1 mg Oral QPM  . mometasone-formoterol  2 puff Inhalation BID  . montelukast  10 mg Oral QHS  . morphine  15 mg Oral 3 times per day  . multivitamin with minerals  1 tablet Oral Daily  . nortriptyline  75 mg Oral QHS  . prazosin  2 mg Oral QHS  . sertraline  50 mg Oral Daily  . tiotropium  18 mcg Inhalation Daily  . 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 PTSD as per psych evaluation. Now with complains of lack sensation without pain in her genitalia, groin area, and right leg from the knee down. Chronic intermittent right foot weakness as described above. Neuro-exam is confound by some functional overlay but I couldn't identify obvious myelopathic signs on exam. Recommend: MRI thoracic and lumbar spine with/without contrast to further investigate her new complains. Will follow up if MRI documents structural abnormality that could explain her symptoms.  Dorian Pod, MD [facility] [phone]  05/30/2015, 10:32 AM  Addendum: MRI thoracic and lumbar spine personally reviewed and showed no significant pathology to explain patient symptoms. No further inpatient neurology work up required. Will sign off.  Dorian Pod, MD

## 2015-05-30 NOTE — Progress Notes (Addendum)
PROGRESS NOTE    Jill Santiago PIR:[id number] DOB: Apr 09, 1971 DOA: 05/26/2015 PCP: [facility]/Dr. Dema Severin Patient does not follow with a neurologist.  HPI/Brief narrative 44 year old female patient with prolonged reported history of seizures, has been off of antiepileptic medications (phenobarbitone and Dilantin for >6 years), currently on high-dose Neurontin which was probably started for chronic pain, does not drive >1 year, chronic low back pain, COPD, iron deficiency anemia, history of sporadic seizures with no clear-cut pattern, transferred from [facility] with history of frequent seizure like episodes 3 days, for continuous EEG monitoring and neurology input. Seizures are reported as generalized tonic-clonic, altered/LOC at times, some episodes associated with urinary incontinence and tongue biting with postictal phase at times. Claims to have chronic right lower extremity weakness. MRI brain and C-spine at [facility] apparently unremarkable. X-ray right shoulder at [facility] concerning for possible right shoulder rotator cuff tear. Neurology has evaluated. Patient completed a 24-hour video EEG monitoring. 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 Psychiatry consulted 12/16-input pending.   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 - CT head and MRI brain without acute findings. MRI C-spine with chronic findings but no acute findings.  - Neurology consultation and follow-up appreciated. - She has had several atypical tonic-clonic seizure like activity since admission to stepdown unit. - She completed a 24-hour continuous video EEG monitoring. As per discussion with neurology, Seizure like activity without concomitant EEG correlate. 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 Neurology recommends psychiatric consultation. -  Neurology recommends discontinuing when necessary Ativan.  - Continue current dose of Neurontin. - Tricyclics decrease seizure threshold and may have to consider reducing dose.  Posttraumatic stress disorder - Psychiatry input appreciated and started Zoloft and prazosin. Remeron was discontinued. Outpatient psychiatry follow-up.  Acute urinary retention/possible UTI - She was in and out cath several times since admission, subsequently Foley cath placed for 24 hours. She failed another trial of wide on 11/17 and Foley catheter was replaced on 11/17. - Urology consulted and input appreciated: She was evaluated at [facility] in 2013 by Dr. Denese Killings for complaints of urinary urgency and urge incontinence. She also had vague neurological complaints at that time like currently. She was evaluated with an MRI of the brain which was normal. She did not follow-up with them thereafter. - Urology recommends urine culture and treatment of UTI if present, avoiding anticholinergic medications for overactive bladder such as Vesicare and Enablex, weaning narcotics and benzos (this can be best achieved as outpatient), leaving Foley catheter in place for a week given bladder stretch injury-can have this removed with PCP in one week with trial of void - Due to vague neurological symptoms, requested neurology to reassess and are getting MRI of the thoracic and lumbar spine. - She can follow up with her urologist, Dr. Alto Denver, at [facility] or she can be referred to see Dr. Matilde Sprang at [facility] urology for additional outpatient work up if no UTI is present. - Urine microscopy 12/13 had shown many bacteria, positive for nitrates but had only 0-5 WBCs. Check urine culture. Start IV Rocephin pending urine culture results.  Right shoulder pain with possible rotator cuff tear - Seen on x-ray. Patient complained of some pain in the right shoulder, worse with movement on 12/16 . Discussed with on call orthopedics on 12/15 Merla Riches, [place] with Dr. Veverly Fells) who recommended MRI of the right shoulder and may follow  up as outpatient. Discussed with patient and her husband and explained that since she is having these movement disorder at this time, may be reasonable to hold off on MRI of the shoulder, treat symptomatically and can follow outpatient with orthopedics and they were agreeable. - Patient still has intermittent pain on specific shoulder movements but prefers to complete workup i.e. MRI as outpatient with outpatient follow-up with orthopedics.   Chronic pain - Continue home regimen. Follows with pain management at [facility]. Judicious use of opioids.   COPD - Stable.  Iron deficiency anemia - Stable.  Reported history of renal cell carcinoma and previous neck mass - Outpatient follow-up. No neck mass seen on MRI of cervical spine.  ? Substance abuse - UDS positive for cannabinoid, tricyclic, opiate and benzodiazepine. Some of these may be her prescription meds. - Patient states that she does not abuse drugs but spouse indicates that he smokes marijuana and maybe that's why her urine drug screen is positive.    DVT prophylaxis: SCDs  Code Status: Full  Family Communication: none at bedside today.  Disposition Plan:  possible discharge home 12/19. Vitamin mobilize out of bed with assistance and get PT and OT to evaluate.    Consultants:  Neurology  Psychiatry-pending  Procedures:  Continuous video EEG monitoring: Electrodiagnostic Diagnosis: This was a normal awake and asleep continuous 23 hour video EEG monitoring. There were 6 push button events recorded consisting of some generalized shaking and trembling.  Clinical Correlation: The six observed pushbutton events were all felt to be nonepileptic in nature. There were no electrographic seizures nor asymmetries noted.   Foley catheter.    Antibiotics:  None    Subjective: Urinary retention. Had Foley catheter placed back on 12/17.  Seizure like activity episodes seem to have decreased.   Objective: Filed Vitals:   05/30/15 0000 05/30/15 0200 05/30/15 0400 05/30/15 0500  BP: 93/60 89/57 90/54  89/62  Pulse: 88 78 73 69  Temp:   97.8 F (36.6 C)   TempSrc:   Oral   Resp: 13 13 12 11   Height:      Weight:      SpO2: 96% 95% 97% 97%    Intake/Output Summary (Last 24 hours) at 05/30/15 0713 Last data filed at 05/29/15 2359  Gross per 24 hour  Intake      0 ml  Output   1950 ml  Net  -1950 ml   Filed Weights   05/26/15 1840 05/26/15 2100  Weight: 66.8 kg (147 lb 4.3 oz) 66.2 kg (145 lb 15.1 oz)     Exam:  General exam: Pleasant young female lying comfortably in bed.  Respiratory system: Clear. No increased work of breathing. Cardiovascular system: S1 & S2 heard, RRR. No JVD, murmurs, gallops, clicks or pedal edema. Telemetry: Sinus rhythm. Some artifacts related to movements. Gastrointestinal system: Abdomen is nondistended, soft and nontender. Normal bowel sounds heard. Central nervous system: Alert and oriented. No focal neurological deficits. Extremities: Symmetric 5 x 5 power except right lower extremity where? 4 x 5 power-seems to be providing inconsistent effort. No acute findings on right shoulder exam except some restricted movement secondary to pain on abduction and elevation above shoulder level.   Data Reviewed: Basic Metabolic Panel:  Recent Labs Lab 05/25/15 1439 05/27/15 0254  NA 137 138  K 4.1 3.5  CL 101 109  CO2 24 24  GLUCOSE 99 89  BUN 7 <5*  CREATININE 0.65 0.59  CALCIUM 8.9 8.1*  Liver Function Tests:  Recent Labs Lab 05/25/15 1439 05/27/15 0254  AST 17 13*  ALT 10* 11*  ALKPHOS 38 29*  BILITOT 0.6 0.7  PROT 7.2 5.3*  ALBUMIN 4.4 3.0*    Recent Labs Lab 05/25/15 1439  LIPASE 18   No results for input(s): AMMONIA in the last 168 hours. CBC:  Recent Labs Lab 05/25/15 1439 05/27/15 0254 05/28/15 0306  WBC 9.2 7.8 7.1  NEUTROABS  --  4.9  --   HGB  13.3 11.3* 11.6*  HCT 41.2 35.2* 34.6*  MCV 86.0 87.3 86.5  PLT 279 253 237   Cardiac Enzymes: No results for input(s): CKTOTAL, CKMB, CKMBINDEX, TROPONINI in the last 168 hours. BNP (last 3 results) No results for input(s): PROBNP in the last 8760 hours. CBG:  Recent Labs Lab 05/25/15 1446 05/26/15 0924 05/26/15 1448 05/26/15 1614  GLUCAP 93 85 104* 83    Recent Results (from the past 240 hour(s))  MRSA PCR Screening     Status: None   Collection Time: 05/26/15  9:29 AM  Result Value Ref Range Status   MRSA by PCR NEGATIVE NEGATIVE Final    Comment:        The GeneXpert MRSA Assay (FDA approved for NASAL specimens only), is one component of a comprehensive MRSA colonization surveillance program. It is not intended to diagnose MRSA infection nor to guide or monitor treatment for MRSA infections.   MRSA PCR Screening     Status: None   Collection Time: 05/26/15  7:36 PM  Result Value Ref Range Status   MRSA by PCR NEGATIVE NEGATIVE Final    Comment:        The GeneXpert MRSA Assay (FDA approved for NASAL specimens only), is one component of a comprehensive MRSA colonization surveillance program. It is not intended to diagnose MRSA infection nor to guide or monitor treatment for MRSA infections.          Studies: No results found.      Scheduled Meds: . cyclobenzaprine  10 mg Oral TID  . darifenacin  7.5 mg Oral Daily  . ferrous sulfate  325 mg Oral TID  . gabapentin  900 mg Oral BID WC  . gabapentin  1,200 mg Oral QHS  . glycopyrrolate  1 mg Oral QPM  . mometasone-formoterol  2 puff Inhalation BID  . montelukast  10 mg Oral QHS  . morphine  15 mg Oral 3 times per day  . multivitamin with minerals  1 tablet Oral Daily  . nortriptyline  75 mg Oral QHS  . prazosin  2 mg Oral QHS  . sertraline  50 mg Oral Daily  . tiotropium  18 mcg Inhalation Daily  . Vitamin D (Ergocalciferol)  50,000 Units Oral Q7 days   Continuous Infusions:     Principal Problem:   Posttraumatic stress disorder Active Problems:   Seizures (HCC)   Chronic pain syndrome   Seizure (HCC)   Seizure-like activity (HCC)    Time spent: 35 minutes.    Vernell Leep, MD, FACP, FHM. [facility] Pager [phone]  If 7PM-7AM, please contact night-coverage [email or web address] Password [email or web address] 05/30/2015, 7:13 AM    LOS: 4 days

## 2015-05-30 NOTE — Evaluation (Signed)
Physical Therapy Evaluation Patient Details Name: Jill Santiago MRN: [id number] DOB: 1971-02-11 Today's Date: 05/30/2015   History of Present Illness    44 year old female patient with prolonged reported history of seizures, has been off of antiepileptic medications (phenobarbitone and Dilantin for >6 years), currently on high-dose Neurontin which was probably started for chronic pain, does not drive >1 year, chronic low back pain, COPD, iron deficiency anemia, history of sporadic seizures with no clear-cut pattern, transferred from [facility] with history of frequent seizure like episodes 3 days, for continuous EEG monitoring and neurology input. Seizures are reported as generalized tonic-clonic, altered/LOC at times, some episodes associated with urinary incontinence and tongue biting with postictal phase at times. Claims to have chronic right lower extremity weakness. MRI brain and C[facility]spine at [facility] apparently unremarkable. X-ray right shoulder at [facility] concerning for possible right shoulder rotator cuff tear. Neurology has evaluated. Patient completed a 24-hour video EEG monitoring. 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    Clinical Impression  Pt presents with moderate to severe functional limitations due to apparent weakness/decr motor control, decr sensation, and pain impacting basic mobility and gait.  Pt states she's motivated to get better and be independent.  She is unsure whether she will have social support/caregiver at d/c but states she has a friend she can ask.   Discussed at length d/c options, pt prefers to recover at home.  Without laboratory or imaging evidence to support etiology of symptoms its unclear whether she would qualify for inpt postacute settings to pursue rehab.  Have placed CIR screen c/s nevertheless to consider this case based on clinical presentation.  Recommend: 1. Consider sling to RUE for support if suspected rotator cuff.  Apply ice 15-20 min  on, 30 min off as needed for pain. Consider ortho consult  2. Consider simple brace to right ankle to support and promote normal alignment.  Would a lace-up ankle brace be sufficient? Ask ortho tech to apply 3.  CIR consult to assess whether pt would meet requirements for admission. 4. CSW to assist with SNF search if CIR is not an option 5. RNCM to assist with HHPT if pt does not meet qualifications for inpt postacute rehab.  Plan to initiate PT in acute setting, treat clinical signs and symptoms, and assist with d/c recommendations.     Follow Up Recommendations CIR    Equipment Recommendations  Cane    Recommendations for Other Services Rehab consult     Precautions / Restrictions Precautions Precautions: Fall      Mobility  Bed Mobility Overal bed mobility: Needs Assistance Bed Mobility: Supine to Sit     Supine to sit: Supervision     General bed mobility comments: without cues pt able to transition to left EOB using rail, guards RUE and pulls RLE off EOB  Transfers Overall transfer level: Needs assistance Equipment used: 1 person hand held assist (front guard technique) Transfers: Sit to/from [employer] Transfers Sit to Stand: Mod assist Stand pivot transfers: Mod assist       General transfer comment: stands with minimal assist but then hangs on therapist; cues to use LEFT intact side; holds RLE in knee flexion resting toes of foot on ground and when transfering flexes downward when stance RLE but does not fall; blocked knee for safety, verbal cues for control  Ambulation/Gait                Stairs  Wheelchair Mobility    Modified Rankin (Stroke Patients Only)       Balance Overall balance assessment: History of Falls                                           Pertinent Vitals/Pain Pain Assessment: 0-10 Pain Score: 7  Pain Location: right hip/shoulder Pain Descriptors / Indicators:  Tingling;Sore;Shooting;Pressure;Pins and needles;Numbness;Heaviness;Aching;Constant Pain Intervention(s): Limited activity within patient's tolerance;Repositioned;Patient requesting pain meds-RN notified    Home Living Family/patient expects to be discharged to:: Private residence Living Arrangements: Spouse/significant other Available Help at Discharge: Family;Available PRN/intermittently Type of Home: Mobile home Home Access: Ramped entrance     Home Layout: One level Home Equipment: [place] - 4 wheels;Shower seat;Wheelchair - manual      Prior Function Level of Independence: Independent with assistive device(s)         Comments: variable function per pt, including times when she's independent and times when she requires the w/c.  use furniture in home to support self     Hand Dominance        Extremity/Trunk Assessment   Upper Extremity Assessment: Defer to OT evaluation;RUE deficits/detail RUE Deficits / Details: limited shoulder ROM, pt thinks she hit it when she fell when she had a seizure; unable to AROM >10-20 degrees most planes, applied ice on 15-20, off 30 as needed RUE: Unable to fully assess due to pain       Lower Extremity Assessment: RLE deficits/detail RLE Deficits / Details: foot drop, good tone, able to extend knee to lift foot off bed, reports decr sensation knee to toes       Communication   Communication: No difficulties  Cognition Arousal/Alertness: Awake/alert Behavior During Therapy: Flat affect Overall Cognitive Status: Within Functional Limits for tasks assessed                      General Comments      Exercises        Assessment/Plan    PT Assessment Patient needs continued PT services  PT Diagnosis Hemiplegia dominant side   PT Problem List Impaired sensation;Pain;Decreased knowledge of use of DME;Decreased mobility;Decreased range of motion;Decreased strength  PT Treatment Interventions DME instruction;Gait  training;Functional mobility training;Therapeutic activities;Therapeutic exercise;Neuromuscular re-education;Patient/family education;Wheelchair mobility training   PT Goals (Current goals can be found in the Care Plan section) Acute Rehab PT Goals Patient Stated Goal: get back my life PT Goal Formulation: With patient Time For Goal Achievement: 06/13/15 Potential to Achieve Goals: Fair    Frequency Min 3X/week   Barriers to discharge Inaccessible home environment;Decreased caregiver support pt unsure she can get help at home that is consistent and reliable    Co-evaluation               End of Session Equipment Utilized During Treatment: Gait belt Activity Tolerance: Patient tolerated treatment well Patient left: in chair;with call bell/phone within reach;with nursing/sitter in room Nurse Communication: Mobility status;Patient requests pain meds         Time: 5003-7048 PT Time Calculation (min) (ACUTE ONLY): 66 min   Charges:   PT Evaluation $Initial PT Evaluation Tier I: 1 Procedure PT Treatments $Therapeutic Activity: 38-52 mins   PT G Codes:        Herbie Drape 05/30/2015, 5:22 PM
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
## 2015-05-30 NOTE — Consult Note (Signed)
Urology Consult  Referring physician: Vernell Leep, MD Reason for referral: Urinary retention  Chief Complaint: Urinary retention  History of Present Illness:  44 yo F with complex PMHx including chronic back pain, chronic narcotic use, lumbar disc disease, urinary urgency and stress urinary incontinence who was admitted for seizure-like activity and diagnosed with PTSD and confirmed psychogenic nonepileptic seizures. She developed urinary retention up to 700-800 mL over the last few days while admitted. She was I&O'd several times and subsequently had a foley catheter placed for 24 hours. She failed another trial of void yesterday and had an foley catheter replaced yesterday. She also has a new constellation of vague neurologic complaints today including lower extremity weakness. Review of outpatient records from [facility] in 2013 include lower and upper extremity weakness similar to what she is describing. Neurology was consulted today for further evaluation of this and recommended an MRI of the thoracic and lumbar spine.  Today she reports a long history of intermittent urinary hesitancy, straining and intermittency but no prior history of acute urinary retention. She has been off the Enabelex for a year but her home medications listed Vesicare as a current medication. She reports that she is not been taking any medications for her OAB of recent. While hospitalized she had acute worsening of her chronic straining resulting in urinary retention.  Of note, she had a UA on 05/25/15 which was grossly positive but no follow up urine culture. She is not currently on any antibiotics.  While admitted she has been receiving morphine, oxycodone, MS contin, and flexeril.   She was last evaluated at [facility] in 2013 by Dr. Alto Denver due to complains of worsening urinary urgency and urge urinary incontinence. She also had vague neurologic complaints at that time and was evaluated with an MRI of the brain to  evaluate for MS which was normal. She did not follow up after this.  Urology was consulted due to urinary retention.  Past Medical History  Diagnosis Date  . Seizures ([place])   . FH: kidney cancer   . Throat cancer ([place])   . Iron deficiency anemia   . Hemorrhoids   . Chronic low back pain   . Urinary incontinence   . Sacroiliac joint dysfunction   . Ectopic pregnancy   . COPD (chronic obstructive pulmonary disease) [facility])    Past Surgical History  Procedure Laterality Date  . Back surgery    . Knee surgery    . Ectopic pregnancy surgery      Medications: I have reviewed the patient's current medications. Allergies:  Allergies  Allergen Reactions  . Azithromycin Hives  . Contrast Media [Iodinated Diagnostic Agents] Hives    Family History  Problem Relation Age of Onset  . Family history unknown: Yes   Social History:  reports that she has never smoked. She does not have any smokeless tobacco history on file. She reports that she does not drink alcohol or use illicit drugs.  ROS 12 system review was performed and was negative except for the pertinent positives listed in the HPI.  Physical Exam:  Vital signs in last 24 hours: Temp:  [97.5 F (36.4 C)-98.4 F (36.9 C)] 97.9 F (36.6 C) (12/18 0800) Pulse Rate:  [69-107] 90 (12/18 1100) Resp:  [7-16] 8 (12/18 1100) BP: (89-152)/(54-131) 110/88 mmHg (12/18 1100) SpO2:  [93 %-98 %] 94 % (12/18 1100) Physical Exam Alert and oriented, cooperative, shakey. No increased work of breathing on room air Abdomen is soft, non-distended. Bladder is  not palpable. No CVA tenderness. Foley catheter in place with cloudy yellow urine.  Laboratory Data:  Results for orders placed or performed during the hospital encounter of 05/26/15 (from the past 72 hour(s))  Pregnancy, urine     Status: None   Collection Time: 05/27/15 12:05 PM  Result Value Ref Range   Preg Test, Ur NEGATIVE NEGATIVE    Comment:        THE SENSITIVITY OF  THIS METHODOLOGY IS >20 mIU/mL.   Urine rapid drug screen (hosp performed)     Status: Abnormal   Collection Time: 05/27/15 12:05 PM  Result Value Ref Range   Opiates POSITIVE (A) NONE DETECTED   Cocaine NONE DETECTED NONE DETECTED   Benzodiazepines POSITIVE (A) NONE DETECTED   Amphetamines NONE DETECTED NONE DETECTED   Tetrahydrocannabinol POSITIVE (A) NONE DETECTED   Barbiturates NONE DETECTED NONE DETECTED    Comment:        DRUG SCREEN FOR MEDICAL PURPOSES ONLY.  IF CONFIRMATION IS NEEDED FOR ANY PURPOSE, NOTIFY LAB WITHIN 5 DAYS.        LOWEST DETECTABLE LIMITS FOR URINE DRUG SCREEN Drug Class       Cutoff (ng/mL) Amphetamine      1000 Barbiturate      200 Benzodiazepine   767 Tricyclics       209 Opiates          300 Cocaine          300 THC              50   CBC     Status: Abnormal   Collection Time: 05/28/15  3:06 AM  Result Value Ref Range   WBC 7.1 4.0 - 10.5 K/uL   RBC 4.00 3.87 - 5.11 MIL/uL   Hemoglobin 11.6 (L) 12.0 - 15.0 g/dL   HCT 34.6 (L) 36.0 - 46.0 %   MCV 86.5 78.0 - 100.0 fL   MCH 29.0 26.0 - 34.0 pg   MCHC 33.5 30.0 - 36.0 g/dL   RDW 12.6 11.5 - 15.5 %   Platelets 237 150 - 400 K/uL   Recent Results (from the past 240 hour(s))  MRSA PCR Screening     Status: None   Collection Time: 05/26/15  9:29 AM  Result Value Ref Range Status   MRSA by PCR NEGATIVE NEGATIVE Final    Comment:        The GeneXpert MRSA Assay (FDA approved for NASAL specimens only), is one component of a comprehensive MRSA colonization surveillance program. It is not intended to diagnose MRSA infection nor to guide or monitor treatment for MRSA infections.   MRSA PCR Screening     Status: None   Collection Time: 05/26/15  7:36 PM  Result Value Ref Range Status   MRSA by PCR NEGATIVE NEGATIVE Final    Comment:        The GeneXpert MRSA Assay (FDA approved for NASAL specimens only), is one component of a comprehensive MRSA colonization surveillance program. It  is not intended to diagnose MRSA infection nor to guide or monitor treatment for MRSA infections.    Creatinine:  Recent Labs  05/25/15 1439 05/27/15 0254  CREATININE 0.65 0.59    Impression/Assessment:  44 yo F with complex PMHx with urinary retention while hospitalized.  Plan:  1. Recommend sending catheterized urine culture and treatment of UTI if present - this is a common cause of urinary retention 2. Recommend avoidance of anticholinergic medications for OAB such as  vesicare and enabelex as this can worsen retention 3. Recommend weaning narcotics and benzos as these make the bladder "lazy" and are common causes of urinary retention 4. Recommend leaving foley catheter in place x 7 days given bladder stretch injury - she can have this removed with PCP in 1 week with trial of void 5. Defer to neurology for work up of possible neurologic component to urinary retention 6. She can follow up with her urologist, Dr. Alto Denver, at [facility] or she can be referred to see Dr. Matilde Sprang at [facility] for additional outpatient work up if no UTI is present. 7. No further urologic work up is indicated as an inpatient 8. Urology will sign off  Acie Fredrickson 05/30/2015, 11:59 AM

## 2015-05-30 NOTE — Progress Notes (Signed)
Rehab Admissions Coordinator Note:  Patient was screened by Cleatrice Burke for appropriateness for an Inpatient Acute Rehab Consult per PT recommendation.   At this time, we are recommending HH or SNF if pt continues functional decline. No definitive diagnosis for intense inpt rehab with medical workup .  Cleatrice Burke 05/30/2015, 7:38 PM  I can be reached at [phone].

## 2015-05-31 DIAGNOSIS — N3000 Acute cystitis without hematuria: Secondary | ICD-10-CM

## 2015-05-31 MED ORDER — CETYLPYRIDINIUM CHLORIDE 0.05 % MT LIQD
7.0000 mL | Freq: Two times a day (BID) | OROMUCOSAL | Status: DC
  Administered 2015-05-31 – 2015-06-01 (×3): 7 mL via OROMUCOSAL

## 2015-05-31 MED ORDER — SENNA 8.6 MG PO TABS
2.0000 | ORAL_TABLET | Freq: Every day | ORAL | Status: DC
  Administered 2015-05-31 – 2015-06-01 (×2): 17.2 mg via ORAL
  Filled 2015-05-31 (×2): qty 2

## 2015-05-31 NOTE — Progress Notes (Signed)
PROGRESS NOTE    Jill Santiago ZDG:[id number] DOB: Nov 07, 1970 DOA: 05/26/2015 PCP: [facility]/Dr. Dema Severin Patient does not follow with a neurologist.  HPI/Brief narrative 44 year old female patient with prolonged reported history of seizures, has been off of antiepileptic medications (phenobarbitone and Dilantin for >6 years), currently on high-dose Neurontin which was probably started for chronic pain, does not drive >1 year, chronic low back pain, COPD, iron deficiency anemia, history of sporadic seizures with no clear-cut pattern, transferred from [facility] with history of frequent seizure like episodes 3 days, for continuous EEG monitoring and neurology input. MRI brain and C-spine at [facility] apparently unremarkable. X-ray right shoulder at [facility] concerning for possible right shoulder rotator cuff tear. 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 Psychiatry have initiated treatment for PTSD. Discussed with orthopedics who recommend outpatient evaluation and follow-up regarding right rotator cuff injury. Urology evaluated for urinary retention and recommend discharging on Foley and outpatient follow-up. CIR screened and not felt appropriate for CIR. Awaiting disposition re home versus SNF for rehabilitation.  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 MRI C-spine with chronic findings but no acute findings.  - Neurology extensively evaluated. Patient completed 24 hour video EEG monitoring. 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 Neurology recommended psychiatry consultation. - Psychiatry started new medications for PTSD. - Seizure like activity have significantly decreased compared to earlier in admission.  Posttraumatic stress disorder - Psychiatry input appreciated and started Zoloft and prazosin. Remeron was  discontinued. Outpatient psychiatry follow-up.  Acute urinary retention/possible UTI - She was in and out cath several times since admission, subsequently Foley cath placed for 24 hours. She failed another trial of wide on 11/17 and Foley catheter was replaced on 11/17. - Urology consulted and input appreciated: She was evaluated at [facility] in 2013 by Dr. Denese Killings for complaints of urinary urgency and urge incontinence. She also had vague neurological complaints at that time like currently. She was evaluated with an MRI of the brain which was normal. She did not follow-up with them thereafter. - Urology recommends urine culture and treatment of UTI if present, avoiding anticholinergic medications for overactive bladder such as Vesicare and Enablex, weaning narcotics and benzos (this can be best achieved as outpatient), leaving Foley catheter in place for a week given bladder stretch injury-can have this removed with PCP in one week with trial of void - MRI lumbar and thoracic spine do not show any acute findings. Chronic changes seen. - She can follow up with her urologist, Dr. Alto Denver, at [facility] or she can be referred to see Dr. Matilde Sprang at [facility] for additional outpatient work up if no UTI is present. - Urine microscopy 12/13 had shown many bacteria, positive for nitrates but had only 0-5 WBCs. Follow-up urine culture. Start IV Rocephin pending urine culture results. Did have some dysuria.  Right shoulder pain with possible rotator cuff tear - Seen on x-ray. Patient complained of some pain in the right shoulder, worse with movement on 12/16 . Discussed with on call orthopedics on 12/15 Merla Riches, [place] with Dr. Veverly Fells) who recommended MRI of the right shoulder and may follow up as outpatient. Discussed with patient and her husband and explained that since she is having these movement disorder at this time, may be reasonable to hold off on MRI of the shoulder, treat symptomatically and can  follow outpatient with orthopedics and they were agreeable. -  Patient still has intermittent pain on specific shoulder movements but prefers to complete workup i.e. MRI as outpatient with outpatient follow-up with orthopedics.   Chronic pain - Continue home regimen. Follows with pain management at [facility]. Judicious use of opioids.   COPD - Stable.  Iron deficiency anemia - Stable.  Reported history of renal cell carcinoma and previous neck mass - Outpatient follow-up. No neck mass seen on MRI of cervical spine.  ? Substance abuse - UDS positive for cannabinoid, tricyclic, opiate and benzodiazepine. Some of these may be her prescription meds. - Patient states that she does not abuse drugs but spouse indicates that he smokes marijuana and maybe that's why her urine drug screen is positive.    DVT prophylaxis: SCDs  Code Status: Full  Family Communication: none at bedside today.  Disposition Plan:  possible discharge home Vs SNF 12/20.    Consultants:  Neurology  Psychiatry  Urology  Procedures:  Continuous video EEG monitoring: Electrodiagnostic Diagnosis: This was a normal awake and asleep continuous 23 hour video EEG monitoring. There were 6 push button events recorded consisting of some generalized shaking and trembling.  Clinical Correlation: The six observed pushbutton events were all felt to be nonepileptic in nature. There were no electrographic seizures nor asymmetries noted.   Foley catheter.   Antibiotics:  IV Rocephin  Subjective: Decrease seizure like activities-? 1 in the last 24 hours. Right shoulder pain reported to be less. Chronic right hip site pain. Denies any other complaints.  Objective: Filed Vitals:   05/31/15 0400 05/31/15 0600 05/31/15 0807 05/31/15 0859  BP: 101/70 111/77  119/97  Pulse: 76 81  92  Temp:  97.9 F (36.6 C)  97.8 F (36.6 C)  TempSrc:  Oral  Axillary  Resp: 9 13  10   Height:      Weight:      SpO2: 95% 97%  99% 95%    Intake/Output Summary (Last 24 hours) at 05/31/15 1050 Last data filed at 05/31/15 1000  Gross per 24 hour  Intake    360 ml  Output   3175 ml  Net  -2815 ml   Filed Weights   05/26/15 1840 05/26/15 2100  Weight: 66.8 kg (147 lb 4.3 oz) 66.2 kg (145 lb 15.1 oz)     Exam:  General exam: Pleasant young female lying comfortably in bed.  Respiratory system: Clear. No increased work of breathing. Cardiovascular system: S1 & S2 heard, RRR. No JVD, murmurs, gallops, clicks or pedal edema. Telemetry: Sinus rhythm.  Gastrointestinal system: Abdomen is nondistended, soft and nontender. Normal bowel sounds heard. Central nervous system: Alert and oriented. No focal neurological deficits. Extremities: Symmetric 5 x 5 power except right lower extremity where? 4 x 5 power-seems to be providing inconsistent effort.    Data Reviewed: Basic Metabolic Panel:  Recent Labs Lab 05/25/15 1439 05/27/15 0254  NA 137 138  K 4.1 3.5  CL 101 109  CO2 24 24  GLUCOSE 99 89  BUN 7 <5*  CREATININE 0.65 0.59  CALCIUM 8.9 8.1*   Liver Function Tests:  Recent Labs Lab 05/25/15 1439 05/27/15 0254  AST 17 13*  ALT 10* 11*  ALKPHOS 38 29*  BILITOT 0.6 0.7  PROT 7.2 5.3*  ALBUMIN 4.4 3.0*    Recent Labs Lab 05/25/15 1439  LIPASE 18   No results for input(s): AMMONIA in the last 168 hours. CBC:  Recent Labs Lab 05/25/15 1439 05/27/15 0254 05/28/15 0306  WBC 9.2 7.8  7.1  NEUTROABS  --  4.9  --   HGB 13.3 11.3* 11.6*  HCT 41.2 35.2* 34.6*  MCV 86.0 87.3 86.5  PLT 279 253 237   Cardiac Enzymes: No results for input(s): CKTOTAL, CKMB, CKMBINDEX, TROPONINI in the last 168 hours. BNP (last 3 results) No results for input(s): PROBNP in the last 8760 hours. CBG:  Recent Labs Lab 05/25/15 1446 05/26/15 0924 05/26/15 1448 05/26/15 1614  GLUCAP 93 85 104* 83    Recent Results (from the past 240 hour(s))  MRSA PCR Screening     Status: None   Collection Time:  05/26/15  9:29 AM  Result Value Ref Range Status   MRSA by PCR NEGATIVE NEGATIVE Final    Comment:        The GeneXpert MRSA Assay (FDA approved for NASAL specimens only), is one component of a comprehensive MRSA colonization surveillance program. It is not intended to diagnose MRSA infection nor to guide or monitor treatment for MRSA infections.   MRSA PCR Screening     Status: None   Collection Time: 05/26/15  7:36 PM  Result Value Ref Range Status   MRSA by PCR NEGATIVE NEGATIVE Final    Comment:        The GeneXpert MRSA Assay (FDA approved for NASAL specimens only), is one component of a comprehensive MRSA colonization surveillance program. It is not intended to diagnose MRSA infection nor to guide or monitor treatment for MRSA infections.          Studies: Mr Thoracic Spine W Wo Contrast  05/30/2015  CLINICAL DATA:  Numbness from the medial right knee to the foot. Numbness in the groin and genitalia. EXAM: MRI THORACIC AND LUMBAR SPINE WITHOUT AND WITH CONTRAST TECHNIQUE: Multiplanar and multiecho pulse sequences of the thoracic and lumbar spine were obtained without and with intravenous contrast. CONTRAST:  40m MULTIHANCE GADOBENATE DIMEGLUMINE 529 MG/ML IV SOLN COMPARISON:  MRI of the cervical spine 05/25/2015. Two-view chest x-ray 05/25/2015 FINDINGS: MR THORACIC SPINE FINDINGS Normal signal is present in the thoracic spinal cord. Marrow signal, vertebral body heights, and alignment are normal. No significant disc protrusion or focal stenosis is present within the thoracic spine. Mild facet hypertrophy at T8-9 and T9-10 contributes to mild foraminal narrowing. The foramina are otherwise patent. Central canal is patent throughout. There is some desiccation of the disc at T11-12 and T12-L1 without significant focal protrusion or stenosis. The paraspinous soft tissues are unremarkable No pathologic enhancement is present. MR LUMBAR SPINE FINDINGS Normal signal is present  in the conus medullaris which terminates at T12-L1. Mild endplate marrow changes are present bilaterally at L5-S1. Marrow signal, vertebral body heights, and alignment are otherwise normal. Limited imaging of the abdomen is within normal limits. There is no significant adenopathy. L1-2:  Negative. L2-3:  Negative. L3-4: A shallow central disc protrusion is present. There is no significant stenosis. L4-5: A mild disc bulge is present. Moderate facet hypertrophy is present bilaterally. There is no significant stenosis. L5-S1: A prominent central disc protrusion is present. Mild facet hypertrophy is noted bilaterally. This results in mild bilateral subarticular and foraminal stenosis. No pathologic enhancement is present. IMPRESSION: 1. Mild foraminal narrowing bilaterally at T8-9 and T9-10 due to facet hypertrophy at both levels. 2. No focal thoracic disc protrusion or central canal stenosis within the thoracic spine. 3. Shallow central disc protrusion at L3-4 without significant stenosis. 4. Mild disc bulging at L4-5 and bilateral facet hypertrophy without significant stenosis. 5. Mild subarticular and foraminal  narrowing bilaterally at L5-S1 due to a prominent central disc protrusion and mild bilateral facet hypertrophy. Electronically Signed   By: San Morelle M.D.   On: 05/30/2015 13:57   Mr Lumbar Spine W Wo Contrast  05/30/2015  CLINICAL DATA:  Numbness from the medial right knee to the foot. Numbness in the groin and genitalia. EXAM: MRI THORACIC AND LUMBAR SPINE WITHOUT AND WITH CONTRAST TECHNIQUE: Multiplanar and multiecho pulse sequences of the thoracic and lumbar spine were obtained without and with intravenous contrast. CONTRAST:  57m MULTIHANCE GADOBENATE DIMEGLUMINE 529 MG/ML IV SOLN COMPARISON:  MRI of the cervical spine 05/25/2015. Two-view chest x-ray 05/25/2015 FINDINGS: MR THORACIC SPINE FINDINGS Normal signal is present in the thoracic spinal cord. Marrow signal, vertebral body  heights, and alignment are normal. No significant disc protrusion or focal stenosis is present within the thoracic spine. Mild facet hypertrophy at T8-9 and T9-10 contributes to mild foraminal narrowing. The foramina are otherwise patent. Central canal is patent throughout. There is some desiccation of the disc at T11-12 and T12-L1 without significant focal protrusion or stenosis. The paraspinous soft tissues are unremarkable No pathologic enhancement is present. MR LUMBAR SPINE FINDINGS Normal signal is present in the conus medullaris which terminates at T12-L1. Mild endplate marrow changes are present bilaterally at L5-S1. Marrow signal, vertebral body heights, and alignment are otherwise normal. Limited imaging of the abdomen is within normal limits. There is no significant adenopathy. L1-2:  Negative. L2-3:  Negative. L3-4: A shallow central disc protrusion is present. There is no significant stenosis. L4-5: A mild disc bulge is present. Moderate facet hypertrophy is present bilaterally. There is no significant stenosis. L5-S1: A prominent central disc protrusion is present. Mild facet hypertrophy is noted bilaterally. This results in mild bilateral subarticular and foraminal stenosis. No pathologic enhancement is present. IMPRESSION: 1. Mild foraminal narrowing bilaterally at T8-9 and T9-10 due to facet hypertrophy at both levels. 2. No focal thoracic disc protrusion or central canal stenosis within the thoracic spine. 3. Shallow central disc protrusion at L3-4 without significant stenosis. 4. Mild disc bulging at L4-5 and bilateral facet hypertrophy without significant stenosis. 5. Mild subarticular and foraminal narrowing bilaterally at L5-S1 due to a prominent central disc protrusion and mild bilateral facet hypertrophy. Electronically Signed   By: CSan MorelleM.D.   On: 05/30/2015 13:57        Scheduled Meds: . antiseptic oral rinse  7 mL Mouth Rinse BID  . cefTRIAXone (ROCEPHIN)  IV  1 g  Intravenous Q24H  . cyclobenzaprine  10 mg Oral TID  . ferrous sulfate  325 mg Oral TID  . gabapentin  900 mg Oral BID WC  . gabapentin  1,200 mg Oral QHS  . glycopyrrolate  1 mg Oral QPM  . mometasone-formoterol  2 puff Inhalation BID  . montelukast  10 mg Oral QHS  . morphine  15 mg Oral 3 times per day  . multivitamin with minerals  1 tablet Oral Daily  . nortriptyline  75 mg Oral QHS  . prazosin  2 mg Oral QHS  . sertraline  50 mg Oral Daily  . tiotropium  18 mcg Inhalation Daily  . Vitamin D (Ergocalciferol)  50,000 Units Oral Q7 days   Continuous Infusions:    Principal Problem:   Posttraumatic stress disorder Active Problems:   Seizures (HCC)   Chronic pain syndrome   Seizure (HCC)   Seizure-like activity (HCC)   Acute urinary retention    Time spent: 15 minutes.  Vernell Leep, MD, FACP, FHM. [facility] Pager [phone]  If 7PM-7AM, please contact night-coverage [email or web address] Password [email or web address] 05/31/2015, 10:50 AM    LOS: 5 days

## 2015-05-31 NOTE — Progress Notes (Signed)
05/31/15 1100   Clinical Encounter Type  Visited With Patient  Visit Type Initial  Referral From Care management  Consult/Referral To Chaplain;Social work  Spiritual Encounters  Spiritual Needs Emotional  Stress Factors  Patient Stress Factors Exhausted;Family relationships;Major life changes   Chaplain stopped by to check on Pt. We explored the Pt.'s anger and anxiety relating to her health. Pt. Feels as though the medical team has "written her off" and that the medical team has "believed she is faking her condition" Chaplain offered an apology on behalf of the Medical staff, this brought some solace to the Pt. Chaplain would asked if medical Staff could be a little more Tender in their care towards this Pt. Pt. Says that the nurses have been great and other medical Staff have been judgmental . Chaplain encourages the Pt. To engage in basic activities that nourishes and sustains spiritual,and emotional health. Chaplain also helped the Pt. reflect upon the connections between her faith and her current/past circumstances. In our visit Chaplain helped the Pt. Gain insight into the origins and effects of her feelings of separation from her Mother. We also explored self-image issues and identified and evaluated coping strategies. Chaplain normalized experiences of patient and provided prayer and a silent and supportive presence.   Dante Gang, Chaplain M.Div.

## 2015-05-31 NOTE — Progress Notes (Signed)
Orthopedic Tech Progress Note Patient Details:  Jill Santiago 1971/02/21 [id number]  Patient ID: Jill Santiago, female   DOB: [birth year], 44 y.o.   MRN: [id number] Called in [facility]; spoke with Jill Santiago, [place] 05/31/2015, 2:22 PM

## 2015-05-31 NOTE — Care Management Important Message (Signed)
Important Message  Patient Details  Name: Jill Santiago MRN: [id number] Date of Birth: Oct 17, 1970   Medicare Important Message Given:  Yes    [name] 05/31/2015, 1:31 PM

## 2015-05-31 NOTE — Progress Notes (Signed)
   05/31/15 1100  Clinical Encounter Type  Visited With Patient  Visit Type Initial  Referral From Care management  Consult/Referral To Chaplain;Social work  Spiritual Encounters  Spiritual Needs Emotional  Stress Factors  Patient Stress Factors Exhausted;Family relationships;Major life changes   Chaplain stopped by to check on Pt. We explored the Pt.'s anger and anxiety relating to her health. Pt. Feels as though the medical team has "written her off" and that the medical team has "believed she is faking her condition" Chaplain offered an apology on behalf of the Medical staff, this brought some solace to the Pt. Chaplain would asked if medical Staff could be a little more Tender in their care towards this Pt. Pt. Says that the nurses have been great and other medical Staff have been judemental. Chaplain encourages the Pt. To engage in basic activities that nourishes and sustains spiritual,and emotional health. Chaplain also helped the Pt. reflect upon the connections between her faith and her current/past circumstances. In our visit Chaplain helped the Pt. Gain insight into the origins and effects of her feelings of separation from her Mother. We also explored self-image issues and identified and evaluated coping strategies. Chaplain normalized experiences of patient and provided prayer and a silent and supportive presence.   Dante Gang, Chaplain M.Div.

## 2015-05-31 NOTE — Progress Notes (Signed)
Visitor/friend in room notified staff of patient's seizure activity. Friend says she has siezures while under stress. Upon arrival pt having upper extremity jerking and lower extremity extension posturing while in chair. Placed pt in recliner position. Stayed with patient to monitor safety. Supplemental oxygen via [place] at 2 L/min applied. O2 sat 92 % then increased to 03% after O2 application. Seizure activity lasted 6 minutes and then post ictal. Pt sleepy and tearful. Pt able to whisper her name and that she was "having a lot of pain in right hip." Will continue to monitor pt.

## 2015-05-31 NOTE — Progress Notes (Signed)
Orthopedic Tech Progress Note Patient Details:  Jill Santiago [birth year] [id number]  Ortho Devices Type of Ortho Device: ASO, Arm sling Ortho Device/Splint Location: rue/rle Ortho Device/Splint Interventions: Application   Jill Santiago 05/31/2015, 9:41 AM

## 2015-05-31 NOTE — NC FL2 (Signed)
[place] LEVEL OF CARE SCREENING TOOL     IDENTIFICATION  Patient Name: Jill Santiago Birthdate: [birth year] Sex: female Admission Date (Current Location): 05/26/2015  [facility] and [place] Number:  Engineering geologist and Address:  [facility] [place] [address], [place], [place] [address]      Provider Number: [id number]  Attending Physician Name and Address:  Modena Jansky, MD  Relative Name and Phone Number:       Current Level of Care: Hospital Recommended Level of Care: [place] Prior Approval Number:    Date Approved/Denied:   PASRR Number:    Discharge Plan: SNF    Current Diagnoses: Patient Active Problem List   Diagnosis Date Noted  . Acute urinary retention   . Posttraumatic stress disorder 05/29/2015  . Chronic pain syndrome 05/26/2015  . Seizure ([place]) 05/26/2015  . Seizure-like activity ([place])   . Seizures ([place]) 05/25/2015    Orientation RESPIRATION BLADDER Height & Weight    Self, Time, Situation, Place  O2 (2L [place]) Indwelling catheter 5' 8"  (172.7 cm) 145 lbs.  BEHAVIORAL SYMPTOMS/MOOD NEUROLOGICAL BOWEL NUTRITION STATUS      Continent Diet (mechanical soft)  AMBULATORY STATUS COMMUNICATION OF NEEDS Skin   Limited Assist Verbally Normal                       Personal Care Assistance Level of Assistance  Bathing, Dressing Bathing Assistance: Limited assistance   Dressing Assistance: Limited assistance     Functional Limitations Info             SPECIAL CARE FACTORS FREQUENCY  PT (By licensed PT), OT (By licensed OT) (5/wk)     PT Frequency: 5/wk OT Frequency: 5/wk            Contractures      Additional Factors Info  Code Status, Allergies, Psychotropic Code Status Info: FULL Allergies Info: Azithromycin, Contrast Media Psychotropic Info: zoloft         Current Medications (05/31/2015):  This is the current hospital active medication list Current  Facility-Administered Medications  Medication Dose Route Frequency Provider Last Rate Last Dose  . acetaminophen (TYLENOL) tablet 650 mg  650 mg Oral Q6H PRN Rise Patience, MD   650 mg at 05/26/15 2310   Or  . acetaminophen (TYLENOL) suppository 650 mg  650 mg Rectal Q6H PRN Rise Patience, MD      . albuterol (PROVENTIL) (2.5 MG/3ML) 0.083% nebulizer solution 2.5 mg  2.5 mg Nebulization Q6H PRN Rise Patience, MD      . antiseptic oral rinse (CPC / CETYLPYRIDINIUM CHLORIDE 0.05%) solution 7 mL  7 mL Mouth Rinse BID Modena Jansky, MD   7 mL at 05/31/15 0934  . cefTRIAXone (ROCEPHIN) 1 g in dextrose 5 % 50 mL IVPB  1 g Intravenous Q24H Modena Jansky, MD   1 g at 05/31/15 1454  . cyclobenzaprine (FLEXERIL) tablet 10 mg  10 mg Oral TID Rise Patience, MD   10 mg at 05/31/15 0934  . ferrous sulfate tablet 325 mg  325 mg Oral TID Rise Patience, MD   325 mg at 05/31/15 0933  . gabapentin (NEURONTIN) capsule 900 mg  900 mg Oral BID WC Rise Patience, MD   900 mg at 05/31/15 1149  . gabapentin (NEURONTIN) tablet 1,200 mg  1,200 mg Oral QHS Rise Patience, MD   1,200 mg at 05/30/15 2221  .  glycopyrrolate (ROBINUL) tablet 1 mg  1 mg Oral QPM Rise Patience, MD   1 mg at 05/28/15 2019  . mometasone-formoterol (DULERA) 100-5 MCG/ACT inhaler 2 puff  2 puff Inhalation BID Rise Patience, MD   2 puff at 05/31/15 [phone]  . montelukast (SINGULAIR) tablet 10 mg  10 mg Oral QHS Rise Patience, MD   10 mg at 05/30/15 2208  . morphine (MS CONTIN) 12 hr tablet 15 mg  15 mg Oral 3 times per day Rise Patience, MD   15 mg at 05/31/15 0641  . morphine 2 MG/ML injection 1 mg  1 mg Intravenous Q3H PRN Modena Jansky, MD   1 mg at 05/31/15 1455  . multivitamin with minerals tablet 1 tablet  1 tablet Oral Daily Rise Patience, MD   1 tablet at 05/31/15 [phone]  . nortriptyline (PAMELOR) capsule 75 mg  75 mg Oral QHS Rise Patience, MD   75 mg at 05/30/15  2221  . ondansetron (ZOFRAN) tablet 4 mg  4 mg Oral Q6H PRN Rise Patience, MD   4 mg at 05/31/15 1345  . oxyCODONE (Oxy IR/ROXICODONE) immediate release tablet 5 mg  5 mg Oral TID PRN Modena Jansky, MD   5 mg at 05/31/15 0933  . prazosin (MINIPRESS) capsule 2 mg  2 mg Oral QHS Mojeed Akintayo   2 mg at 05/30/15 2208  . senna (SENOKOT) tablet 17.2 mg  2 tablet Oral Daily Modena Jansky, MD   17.2 mg at 05/31/15 1149  . sertraline (ZOLOFT) tablet 50 mg  50 mg Oral Daily Mojeed Akintayo   50 mg at 05/31/15 0933  . tiotropium (SPIRIVA) inhalation capsule 18 mcg  18 mcg Inhalation Daily Rise Patience, MD   18 mcg at 05/31/15 0806  . Vitamin D (Ergocalciferol) (DRISDOL) capsule 50,000 Units  50,000 Units Oral Q7 days Rise Patience, MD   50,000 Units at 05/27/15 1111     Discharge Medications: Please see discharge summary for a list of discharge medications.  Relevant Imaging Results:  Relevant Lab Results:   Additional Information SS#: [id number]  Cranford Mon, [place]

## 2015-05-31 NOTE — Evaluation (Addendum)
Occupational Therapy Evaluation Patient Details Name: Carolle Ishii MRN: [id number] DOB: 1971-06-02 Today's Date: 05/31/2015    History of Present Illness   44 year old female patient with prolonged reported history of seizures, has been off of antiepileptic medications (phenobarbitone and Dilantin for >6 years), currently on high-dose Neurontin which was probably started for chronic pain, does not drive >1 year, chronic low back pain, COPD, iron deficiency anemia, history of sporadic seizures with no clear-cut pattern, transferred from [facility] with history of frequent seizure like episodes 3 days, for continuous EEG monitoring and neurology input. Seizures are reported as generalized tonic-clonic, altered/LOC at times, some episodes associated with urinary incontinence and tongue biting with postictal phase at times. Claims to have chronic right lower extremity weakness with foot drop. MRI brain and C-spine at [facility] apparently unremarkable. [facility] right shoulder at [facility] concerning for possible right shoulder rotator cuff tear. Neurology has evaluated. Patient completed a 24-hour video EEG monitoring. 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    Clinical Impression   This 44 yo female admitted with above presents to acute OT with decreased use of RUE due to possible rotator cuff tear (to be addressed as an outpatient), foot drop RLE that did not get better post back sx 5 years ago, decreased balance, decreased mobility, increased pain all affecting her ability to care for herself independently/Mod I at home. She will benefit from acute OT with follow OT at SNF.    Follow Up Recommendations  SNF    Equipment Recommendations  None recommended by OT       Precautions / Restrictions Precautions Precautions: Fall Restrictions Weight Bearing Restrictions: No pushing, pulling, lifting with RUE      Mobility Bed Mobility Overal bed mobility: Needs Assistance Bed  Mobility: Supine to Sit     Supine to sit: Supervision     General bed mobility comments: with VCs to not push with RUE  Transfers Overall transfer level: Needs assistance Equipment used: Rolling walker (2 wheeled) Transfers: Sit to/from Stand Sit to Stand: Min assist         General transfer comment: ambulated 20 feet with RW with min A and VCs for how she stepped with her RLE due to foot drop and tendency to walk on the outside of her foot    Balance Overall balance assessment: Needs assistance;History of Falls Sitting-balance support: No upper extremity supported;Feet supported Sitting balance-Leahy Scale: Good     Standing balance support: Bilateral upper extremity supported Standing balance-Leahy Scale: Poor Standing balance comment: reliant on RW                            ADL Overall ADL's : Needs assistance/impaired Eating/Feeding: Modified independent;Sitting   Grooming: Minimal assistance;Sitting   Upper Body Bathing: Minimal assitance;Sitting   Lower Body Bathing: Minimal assistance;Sit to/from stand   Upper Body Dressing : Minimal assistance;Sitting   Lower Body Dressing: Moderate assistance (min A sit<>stand)   Toilet Transfer: Minimal assistance;Ambulation Toilet Transfer Details (indicate cue type and reason): bed>out door and back to recliner 20 feet Toileting- Clothing Manipulation and Hygiene: Minimal assistance;Sit to/from stand                         Pertinent Vitals/Pain Pain Assessment: 0-10 Pain Score: 7  Pain Location: right foot/ankle Pain Descriptors / Indicators: Tender;Tightness;Sore;Aching Pain Intervention(s): Monitored during session;Repositioned (readjusted the lace up ankle brace)     Hand  Dominance Right   Extremity/Trunk Assessment Upper Extremity Assessment Upper Extremity Assessment: RUE deficits/detail RUE Deficits / Details: AROM to about 70 degrees for shoulder flexion, 40 degrees for shoulder  abduction, shoulder external rotation to neutral. PROM beyond this is painful. Have encouraged her to use he arm functionally (ie: basic ADLs) but just not to push, pull, or lift with it--but can use with RW RUE Coordination: decreased gross motor           Communication Communication Communication: No difficulties   Cognition Arousal/Alertness: Awake/alert Behavior During Therapy: WFL for tasks assessed/performed Overall Cognitive Status: Within Functional Limits for tasks assessed                                Home Living Family/patient expects to be discharged to:: Skilled nursing facility Living Arrangements: Spouse/significant other Available Help at Discharge: Family;Available PRN/intermittently Type of Home: Mobile home Home Access: Ramped entrance     Home Layout: One level     Bathroom Shower/Tub: Tub/shower unit;Curtain Shower/tub characteristics: Curtain   Bathroom Accessibility: No   Home Equipment: Environmental consultant - 4 wheels;Shower seat;Wheelchair - manual          Prior Functioning/Environment Level of Independence: Independent with assistive device(s)        Comments: variable function per pt, including times when she's independent and times when she requires the w/c.  use furniture in home to support self    OT Diagnosis: Generalized weakness;Acute pain   OT Problem List: Decreased strength;Decreased range of motion;Impaired balance (sitting and/or standing);Pain;Impaired UE functional use;Decreased knowledge of use of DME or AE   OT Treatment/Interventions: Self-care/ADL training;Therapeutic exercise;Therapeutic activities;DME and/or AE instruction;Balance training;Patient/family education    OT Goals(Current goals can be found in the care plan section) Acute Rehab OT Goals Patient Stated Goal: get back to doing things by myself again OT Goal Formulation: With patient Time For Goal Achievement: 06/14/15 Potential to Achieve Goals: Good  OT  Frequency: Min 2X/week              End of Session Equipment Utilized During Treatment: Gait belt;Rolling walker (RLE lace up ankle brace) Nurse Communication:  (I will page Dr. Algis Liming and ask for an AFO and PRAFO for pt's RLE)  Activity Tolerance: Patient tolerated treatment well Patient left: in chair;with call bell/phone within reach;with family/visitor present   Time: 1143-1220 OT Time Calculation (min): 37 min Charges:  OT General Charges $OT Visit: 1 Procedure OT Evaluation $Initial OT Evaluation Tier I: 1 Procedure OT Treatments $Self Care/Home Management : 8-22 mins  Almon Register [phone] 05/31/2015, 12:46 PM

## 2015-05-31 NOTE — Plan of Care (Signed)
Problem: Bowel/Gastric: Goal: Will not experience complications related to bowel motility Outcome: Not Met (add Reason) Pt has not had bowel movement since admission. Pt does not have any complaint of abdominal pain or pressure. Nurse to encourage pt to pass gas and use bedside commode as needed. Will consult for stool softner or laxative as needed.

## 2015-05-31 NOTE — Progress Notes (Signed)
2015: CCMD calls nurse to express concern that patient may be having a seizure due to artifact in leads. Upon arrival to room, pt is seizing. Stayed with patient throughout seizure and monitored safety. Seizure lasting 8 minutes. Post-ictal, patient was drowsy but able to whisper name and that she was at the hospital. Not long after, she completely awake and alert to take PM meds.   Melody Comas, RN
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
## 2015-05-31 NOTE — Progress Notes (Signed)
Orthopedic Tech Progress Note Patient Details:  Jill Santiago 01-12-1971 [id number]  Ortho Devices Type of Ortho Device:  (prafo boot) Ortho Device/Splint Location: rle Ortho Device/Splint Interventions: Loanne Drilling, Joyleen Haselton 05/31/2015, 1:39 PM

## 2015-06-01 ENCOUNTER — Inpatient Hospital Stay (HOSPITAL_COMMUNITY): Payer: Medicare Other

## 2015-06-01 LAB — URINE CULTURE: Culture: >=100000

## 2015-06-01 MED ORDER — PRAZOSIN HCL 2 MG PO CAPS: 2.0000 mg | ORAL_CAPSULE | Freq: Every day | ORAL | Status: AC

## 2015-06-01 MED ORDER — MORPHINE SULFATE ER 15 MG PO TBCR: 15.0000 mg | EXTENDED_RELEASE_TABLET | Freq: Three times a day (TID) | ORAL | Status: DC

## 2015-06-01 MED ORDER — CEFUROXIME AXETIL 500 MG PO TABS: 500.0000 mg | ORAL_TABLET | Freq: Two times a day (BID) | ORAL | Status: DC

## 2015-06-01 MED ORDER — OXYCODONE HCL 5 MG PO TABS: 5.0000 mg | ORAL_TABLET | Freq: Three times a day (TID) | ORAL | Status: DC | PRN

## 2015-06-01 MED ORDER — SENNA 8.6 MG PO TABS: 2.0000 | ORAL_TABLET | Freq: Two times a day (BID) | ORAL | Status: AC

## 2015-06-01 MED ORDER — MORPHINE SULFATE ER 15 MG PO TBCR: 15.0000 mg | EXTENDED_RELEASE_TABLET | Freq: Three times a day (TID) | ORAL | Status: AC

## 2015-06-01 MED ORDER — ACETAMINOPHEN 325 MG PO TABS: 650.0000 mg | ORAL_TABLET | Freq: Four times a day (QID) | ORAL | Status: AC | PRN

## 2015-06-01 MED ORDER — BISACODYL 10 MG RE SUPP
10.0000 mg | Freq: Every day | RECTAL | Status: DC | PRN
  Administered 2015-06-01: 10 mg via RECTAL
  Filled 2015-06-01: qty 1

## 2015-06-01 MED ORDER — POLYETHYLENE GLYCOL 3350 17 G PO PACK: 17.0000 g | PACK | Freq: Two times a day (BID) | ORAL | Status: AC

## 2015-06-01 MED ORDER — BISACODYL 10 MG RE SUPP: 10.0000 mg | Freq: Every day | RECTAL | Status: AC | PRN

## 2015-06-01 MED ORDER — POLYETHYLENE GLYCOL 3350 17 G PO PACK
17.0000 g | PACK | Freq: Two times a day (BID) | ORAL | Status: DC
  Administered 2015-06-01: 17 g via ORAL
  Filled 2015-06-01: qty 1

## 2015-06-01 MED ORDER — OXYCODONE HCL 5 MG PO TABS: 5.0000 mg | ORAL_TABLET | Freq: Three times a day (TID) | ORAL | Status: AC | PRN

## 2015-06-01 MED ORDER — SENNA 8.6 MG PO TABS: 2.0000 | ORAL_TABLET | Freq: Two times a day (BID) | ORAL | Status: DC

## 2015-06-01 MED ORDER — SERTRALINE HCL 50 MG PO TABS: 50.0000 mg | ORAL_TABLET | Freq: Every day | ORAL | Status: AC

## 2015-06-01 NOTE — Progress Notes (Signed)
Physical Therapy Treatment Patient Details Name: Jill Santiago MRN: [id number] DOB: 05-May-1971 Today's Date: 06/01/2015    History of Present Illness 44 y/o female admitted with seizures with PMH of chronic LBP, COPD, iron def anemia, and sporatic seizures.  Also with chronic R foot drop and possible R shoulder RCT.    PT Comments    Patient able to ambulate much improved with AFO delivered by orthotist during session.  Noted some flexion of knee due to pull of ankle with brace.  Educated pt in orthotic wear (2 hours on 2 hours off initially progressing to all awake hours,) and on skin checks after removal.  Will need SNF rehab prior to d.c home.  Follow Up Recommendations  SNF     Equipment Recommendations  None recommended by PT    Recommendations for Other Services       Precautions / Restrictions Precautions Precautions: Fall    Mobility  Bed Mobility               General bed mobility comments: seated EOB   Transfers Overall transfer level: Needs assistance Equipment used: Rolling walker (2 wheeled) Transfers: Sit to/from Stand Sit to Stand: Min assist         General transfer comment: orthotist arrived and donned AFO prior to standing, assist for balance  Ambulation/Gait Ambulation/Gait assistance: Min guard Ambulation Distance (Feet): 150 Feet   Gait Pattern/deviations: Decreased stride length;Decreased stance time - right     General Gait Details: R knee buckling and near end of walk dragging R toes due to fatigue, and noted with tone in ankle has increased flexion moment at knee causing occasional buckling but no LOB   Stairs            Wheelchair Mobility    Modified Rankin (Stroke Patients Only)       Balance           Standing balance support: Bilateral upper extremity supported Standing balance-Leahy Scale: Poor Standing balance comment: UE support for balance                    Cognition Arousal/Alertness:  Awake/alert Behavior During Therapy: WFL for tasks assessed/performed Overall Cognitive Status: Within Functional Limits for tasks assessed                      Exercises      General Comments        Pertinent Vitals/Pain Pain Assessment: Faces Faces Pain Scale: Hurts even more Pain Location: right UE Pain Descriptors / Indicators: Sore Pain Intervention(s): Monitored during session    Home Living                      Prior Function            PT Goals (current goals can now be found in the care plan section) Progress towards PT goals: Progressing toward goals    Frequency  Min 3X/week    PT Plan Discharge plan needs to be updated    Co-evaluation             End of Session Equipment Utilized During Treatment: Gait belt Activity Tolerance: Patient tolerated treatment well Patient left: in chair;with call bell/phone within reach     Time: 1420-1445 PT Time Calculation (min) (ACUTE ONLY): 25 min  Charges:  $Orthotics Fit Training: 23-37 mins  G CodesReginia Naas 06/01/2015, 3:00 PM  Magda Kiel, [place] 06/01/2015

## 2015-06-01 NOTE — Discharge Summary (Signed)
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 <=1 SENSITIVE Sensitive     CIPROFLOXACIN <=0.25 SENSITIVE Sensitive     GENTAMICIN <=1 SENSITIVE Sensitive     IMIPENEM <=0.25 SENSITIVE Sensitive     NITROFURANTOIN <=16 SENSITIVE Sensitive     TRIMETH/SULFA <=20 SENSITIVE Sensitive     AMPICILLIN/SULBACTAM <=2 SENSITIVE Sensitive     PIP/TAZO <=4 SENSITIVE Sensitive     * >=100,000 COLONIES/mL ESCHERICHIA COLI     Labs: Basic Metabolic Panel:  Recent Labs Lab 05/25/15 1439 05/27/15 0254  NA 137 138  K 4.1 3.5  CL 101 109  CO2 24 24  GLUCOSE 99 89  BUN 7 <5*  CREATININE 0.65 0.59  CALCIUM 8.9 8.1*   Liver Function Tests:  Recent Labs Lab 05/25/15 1439 05/27/15 0254  AST 17 13*  ALT 10* 11*  ALKPHOS 38 29*  BILITOT 0.6 0.7  PROT 7.2 5.3*  ALBUMIN 4.4 3.0*    Recent Labs Lab 05/25/15 1439  LIPASE 18   No results for input(s): AMMONIA in the last 168 hours. CBC:  Recent Labs Lab 05/25/15 1439 05/27/15 0254 05/28/15 0306  WBC 9.2 7.8 7.1   NEUTROABS  --  4.9  --   HGB 13.3 11.3* 11.6*  HCT 41.2 35.2* 34.6*  MCV 86.0 87.3 86.5  PLT 279 253 237   Cardiac Enzymes: No results for input(s): CKTOTAL, CKMB, CKMBINDEX, TROPONINI in the last 168 hours. BNP: BNP (last 3 results) No results for input(s): BNP in the last 8760 hours.  ProBNP (last 3 results) No results for input(s): PROBNP in the last 8760 hours.  CBG:  Recent Labs Lab 05/25/15 1446 05/26/15 0924 05/26/15 1448 05/26/15 1614  GLUCAP 93 85 104* 83        Signed:  Vernell Leep, MD, FACP, FHM. [facility] Pager [phone]  If 7PM-7AM, please contact night-coverage [email or web address] Password [facility] 06/01/2015, 2:27 PM

## 2015-06-01 NOTE — Progress Notes (Signed)
Called report to [place] at [facility] with refusal to take pt without KUB to rule out ileus. Dr. Algis Liming aware and ordered STAT KUB. Attempted to notify social worker to make aware of delay. Will continue to monitor pt.

## 2015-06-01 NOTE — Progress Notes (Signed)
Left message with social worker in regards to request for discharge to SNF/rehab center.

## 2015-06-01 NOTE — Progress Notes (Signed)
[facility] arranged on behalf of pt to [facility].  Pt aware and agreeable to tx.  Creta Levin, LCSW [place] Coverage [phone]

## 2015-06-01 NOTE — Progress Notes (Signed)
Pt transported to [facility] via [facility]. Belongings sent with pt. Pt alert and stable. No seizure like behavior noted. Report called to Mariann Laster, RN at receiving facility. See vital signs flow sheet.

## 2015-08-02 ENCOUNTER — Other Ambulatory Visit
Admission: RE | Admit: 2015-08-02 | Discharge: 2015-08-02 | Disposition: A | Discharge status: Home / Self Care | Payer: Medicare (Managed Care) | Source: Ambulatory Visit | Attending: Family Medicine | Admitting: Family Medicine

## 2015-08-02 DIAGNOSIS — R339 Retention of urine, unspecified: Secondary | ICD-10-CM | POA: Insufficient documentation

## 2015-08-02 DIAGNOSIS — I10 Essential (primary) hypertension: Secondary | ICD-10-CM | POA: Insufficient documentation

## 2015-08-02 LAB — URINALYSIS COMPLETE WITH MICROSCOPIC (ARMC ONLY)
BILIRUBIN URINE: NEGATIVE
GLUCOSE, UA: NEGATIVE mg/dL
KETONES UR: NEGATIVE mg/dL
NITRITE: NEGATIVE
Protein, ur: NEGATIVE mg/dL
Specific Gravity, Urine: 1.003 — ABNORMAL LOW (ref 1.005–1.030)
pH: 7 (ref 5.0–8.0)

## 2015-08-02 LAB — COMPREHENSIVE METABOLIC PANEL
ALT: 28 U/L (ref 14–54)
ANION GAP: 5 (ref 5–15)
AST: 46 U/L — ABNORMAL HIGH (ref 15–41)
Albumin: 3.5 g/dL (ref 3.5–5.0)
Alkaline Phosphatase: 53 U/L (ref 38–126)
BUN: <5 mg/dL — ABNORMAL LOW (ref 6–20)
CALCIUM: 8.8 mg/dL — AB (ref 8.9–10.3)
CO2: 28 mmol/L (ref 22–32)
Chloride: 108 mmol/L (ref 101–111)
Creatinine, Ser: 0.49 mg/dL (ref 0.44–1.00)
GFR calc non Af Amer: >60 mL/min (ref >60)
GLUCOSE: 90 mg/dL (ref 65–99)
POTASSIUM: 4 mmol/L (ref 3.5–5.1)
Sodium: 141 mmol/L (ref 135–145)
TOTAL PROTEIN: 5.9 g/dL — AB (ref 6.5–8.1)

## 2015-08-02 LAB — CBC WITH DIFFERENTIAL/PLATELET
Basophils Absolute: 0 10*3/uL (ref 0–0.1)
Basophils Relative: 1 %
Eosinophils Absolute: 0.2 10*3/uL (ref 0–0.7)
Eosinophils Relative: 3 %
HEMATOCRIT: 36.8 % (ref 35.0–47.0)
HEMOGLOBIN: 11.8 g/dL — AB (ref 12.0–16.0)
LYMPHS ABS: 1.8 10*3/uL (ref 1.0–3.6)
Lymphocytes Relative: 35 %
MCH: 28.1 pg (ref 26.0–34.0)
MCHC: 32.2 g/dL (ref 32.0–36.0)
MCV: 87.2 fL (ref 80.0–100.0)
MONOS PCT: 8 %
Monocytes Absolute: 0.4 10*3/uL (ref 0.2–0.9)
NEUTROS ABS: 2.8 10*3/uL (ref 1.4–6.5)
NEUTROS PCT: 53 %
Platelets: 224 10*3/uL (ref 150–440)
RBC: 4.22 MIL/uL (ref 3.80–5.20)
RDW: 13.9 % (ref 11.5–14.5)
WBC: 5.2 10*3/uL (ref 3.6–11.0)

## 2015-08-04 LAB — URINE CULTURE

## 2016-02-08 ENCOUNTER — Telehealth (HOSPITAL_COMMUNITY): Payer: Self-pay

## 2016-02-08 NOTE — Telephone Encounter (Signed)
[place] ( [facility]) not able to l/m mailbox is full

## 2016-02-08 NOTE — Telephone Encounter (Signed)
LMOM for pt to call back and schedule appt

## 2016-08-18 ENCOUNTER — Emergency Department: Payer: Medicare Other

## 2016-08-18 ENCOUNTER — Emergency Department
Admission: EM | Admit: 2016-08-18 | Discharge: 2016-08-18 | Disposition: A | Discharge status: Home / Self Care | Payer: Medicare Other | Attending: Student in an Organized Health Care Education/Training Program | Admitting: Student in an Organized Health Care Education/Training Program

## 2016-08-18 DIAGNOSIS — Z79899 Other long term (current) drug therapy: Secondary | ICD-10-CM | POA: Insufficient documentation

## 2016-08-18 DIAGNOSIS — R0981 Nasal congestion: Secondary | ICD-10-CM | POA: Diagnosis not present

## 2016-08-18 DIAGNOSIS — W050XXA Fall from non-moving wheelchair, initial encounter: Secondary | ICD-10-CM | POA: Insufficient documentation

## 2016-08-18 DIAGNOSIS — J449 Chronic obstructive pulmonary disease, unspecified: Secondary | ICD-10-CM | POA: Insufficient documentation

## 2016-08-18 DIAGNOSIS — S0990XA Unspecified injury of head, initial encounter: Secondary | ICD-10-CM | POA: Insufficient documentation

## 2016-08-18 DIAGNOSIS — Y939 Activity, unspecified: Secondary | ICD-10-CM | POA: Insufficient documentation

## 2016-08-18 DIAGNOSIS — Y999 Unspecified external cause status: Secondary | ICD-10-CM | POA: Insufficient documentation

## 2016-08-18 DIAGNOSIS — Y929 Unspecified place or not applicable: Secondary | ICD-10-CM | POA: Insufficient documentation

## 2016-08-18 MED ORDER — PSEUDOEPHEDRINE HCL ER 120 MG PO TB12: 120.0000 mg | ORAL_TABLET | Freq: Two times a day (BID) | ORAL | 2 refills | Status: AC | PRN

## 2016-08-18 NOTE — ED Triage Notes (Signed)
Pt reports falling out of wheelchair two days ago. Pt reports chair fell backwards and she hit her head on hard floor. Pt reports vision become blurred the day after. Pt reports nausea but this is not unusual for her. Denies vomiting. Pt denies LOC.

## 2016-08-18 NOTE — ED Provider Notes (Signed)
[facility] Emergency Department Provider Note   ____________________________________________   None    (approximate)  I have reviewed the triage vital signs and the nursing notes.   HISTORY  Chief Complaint Head Injury    HPI Jill Santiago is a 46 y.o. female patient complain of blurry vision and sinus pressure or 2 days. Patient is concerned she fell out of a wheelchair and hit her head on the floor 2 days ago. Patient stated onset of complaint was the day after she fell. Patient also state there is intermittent nausea ,not unusual, secondary to her medical problems.Patient rates her pain as a 6/10. Patient described a pain as "achy and pressure". No palliative measures taken for this complaint.   Past Medical History:  Diagnosis Date  . Chronic low back pain   . COPD (chronic obstructive pulmonary disease) ([place])   . Ectopic pregnancy   . FH: kidney cancer   . Hemorrhoids   . Iron deficiency anemia   . Sacroiliac joint dysfunction   . Seizures ([place])   . Throat cancer ([place])   . Urinary incontinence     Patient Active Problem List   Diagnosis Date Noted  . Acute urinary retention   . Posttraumatic stress disorder 05/29/2015  . Chronic pain syndrome 05/26/2015  . Seizure ([place]) 05/26/2015  . Seizure-like activity ([place])   . Seizures ([place]) 05/25/2015    Past Surgical History:  Procedure Laterality Date  . BACK SURGERY    . ECTOPIC PREGNANCY SURGERY    . KNEE SURGERY      Prior to Admission medications   Medication Sig Start Date End Date Taking? Authorizing Provider  acetaminophen (TYLENOL) 325 MG tablet Take 2 tablets (650 mg total) by mouth every 6 (six) hours as needed for mild pain, moderate pain, fever or headache (or Fever >/= 101). 06/01/15   Modena Jansky, MD  albuterol (PROVENTIL HFA;VENTOLIN HFA) 108 (90 BASE) MCG/ACT inhaler Inhale 2 puffs into the lungs every 6 (six) hours as needed for wheezing or shortness of breath.     Historical Provider, MD  albuterol (PROVENTIL) (2.5 MG/3ML) 0.083% nebulizer solution Take 2.5 mg by nebulization every 6 (six) hours as needed for wheezing or shortness of breath.    Historical Provider, MD  bisacodyl (DULCOLAX) 10 MG suppository Place 1 suppository (10 mg total) rectally daily as needed for moderate constipation. 06/01/15   Modena Jansky, MD  cefUROXime (CEFTIN) 500 MG tablet Take 1 tablet (500 mg total) by mouth 2 (two) times daily with a meal. Start 06/02/15 and discontinue after 06/05/2015 doses. 06/02/15   Modena Jansky, MD  cyclobenzaprine (FLEXERIL) 10 MG tablet Take 10 mg by mouth 3 (three) times daily.    Historical Provider, MD  ferrous sulfate 325 (65 FE) MG tablet Take 325 mg by mouth 3 (three) times daily.    Historical Provider, MD  Fluticasone-Salmeterol (ADVAIR) 250-50 MCG/DOSE AEPB Inhale 1 puff into the lungs 2 (two) times daily.    Historical Provider, MD  gabapentin (NEURONTIN) 600 MG tablet Take 900-1,200 mg by mouth 3 (three) times daily. Pt takes one and one-half tablet in the morning and at noon and two tablets at bedtime.    Historical Provider, MD  glycopyrrolate (ROBINUL) 1 MG tablet Take 1 mg by mouth every evening.    Historical Provider, MD  montelukast (SINGULAIR) 10 MG tablet Take 10 mg by mouth at bedtime.    Historical Provider, MD  morphine (MS CONTIN) 15 MG  12 hr tablet Take 1 tablet (15 mg total) by mouth every 8 (eight) hours. 06/01/15   Modena Jansky, MD  Multiple Vitamin (MULTIVITAMIN WITH MINERALS) TABS tablet Take 1 tablet by mouth daily.    Historical Provider, MD  nortriptyline (PAMELOR) 75 MG capsule Take 75 mg by mouth at bedtime.    Historical Provider, MD  ondansetron (ZOFRAN) 4 MG tablet Take 4 mg by mouth every 8 (eight) hours as needed for nausea or vomiting.    Historical Provider, MD  oxyCODONE (OXY IR/ROXICODONE) 5 MG immediate release tablet Take 1 tablet (5 mg total) by mouth 3 (three) times daily as needed for severe  pain. 06/01/15   Modena Jansky, MD  polyethylene glycol (MIRALAX / GLYCOLAX) packet Take 17 g by mouth 2 (two) times daily. 06/01/15   Modena Jansky, MD  prazosin (MINIPRESS) 2 MG capsule Take 1 capsule (2 mg total) by mouth at bedtime. 06/01/15   Modena Jansky, MD  pseudoephedrine (SUDAFED) 120 MG 12 hr tablet Take 1 tablet (120 mg total) by mouth 2 (two) times daily as needed for congestion. 08/18/16 08/18/17  Sable Feil, PA-C  senna (SENOKOT) 8.6 MG TABS tablet Take 2 tablets (17.2 mg total) by mouth 2 (two) times daily. 06/01/15   Modena Jansky, MD  sertraline (ZOLOFT) 50 MG tablet Take 1 tablet (50 mg total) by mouth daily. 06/01/15   Modena Jansky, MD  tiotropium (SPIRIVA) 18 MCG inhalation capsule Place 18 mcg into inhaler and inhale daily.    Historical Provider, MD  Vitamin D, Ergocalciferol, (DRISDOL) 50000 UNITS CAPS capsule Take 50,000 Units by mouth every 7 (seven) days.    Historical Provider, MD    Allergies Azithromycin and Contrast media [iodinated diagnostic agents]  Family History  Problem Relation Age of Onset  . Family history unknown: Yes    Social History Social History  Substance Use Topics  . Smoking status: Never Smoker  . Smokeless tobacco: Not on file  . Alcohol use No    Review of Systems Constitutional: No fever/chills Eyes:Blurry vision for left eye only ENT: No sore throat. Cardiovascular: Denies chest pain. Respiratory: Denies shortness of breath. Gastrointestinal: No abdominal pain.  No nausea, no vomiting.  No diarrhea.  No constipation. Genitourinary: Negative for dysuria. Musculoskeletal: Chronic back pain. Skin: Negative for rash. Neurological:Positive for headaches, but denies focal weakness or numbness. Seizures. Hematological/Lymphatic:Iron deficiency anemia  Allergic/Immunilogical: See allergy list  ____________________________________________   PHYSICAL EXAM:  VITAL SIGNS: ED Triage Vitals  Enc Vitals Group      BP 08/18/16 1209 125/90     Pulse Rate 08/18/16 1209 84     Resp 08/18/16 1209 18     Temp 08/18/16 1209 97.9 F (36.6 C)     Temp Source 08/18/16 1209 Oral     SpO2 08/18/16 1209 97 %     Weight 08/18/16 1210 97 lb (44 kg)     Height 08/18/16 1210 5' 6"  (1.676 m)     Head Circumference --      [facility] --      Pain Score 08/18/16 1210 6     Pain Loc --      Pain Edu? --      Excl. in [place]? --     Constitutional: Alert and oriented. Well appearing and in no acute distress. Eyes: Conjunctivae are normal. PERRL. EOMI. Head: Atraumatic. Nose: No congestion/rhinnorhea.Left maxillary guarding Mouth/Throat: Mucous membranes are moist.  Oropharynx non-erythematous. Neck: No  stridor.  No cervical spine tenderness to palpation. Hematological/Lymphatic/Immunilogical: No cervical lymphadenopathy. Cardiovascular: Normal rate, regular rhythm. Grossly normal heart sounds.  Good peripheral circulation. Respiratory: Normal respiratory effort.  No retractions. Lungs CTAB. Gastrointestinal: Soft and nontender. No distention. No abdominal bruits. No CVA tenderness. Musculoskeletal: No lower extremity tenderness nor edema.  No joint effusions. Neurologic:  Normal speech and language. No gross focal neurologic deficits are appreciated. No gait instability. Skin:  Skin is warm, dry and intact. No rash noted. Psychiatric: Mood and affect are normal. Speech and behavior are normal.  ____________________________________________   LABS (all labs ordered are listed, but only abnormal results are displayed)  Labs Reviewed - No data to display ____________________________________________  EKG   ____________________________________________  RADIOLOGY  CT of head was unremarkable ____________________________________________   PROCEDURES  Procedure(s) performed: None  Procedures  Critical Care performed: No  ____________________________________________   INITIAL IMPRESSION / ASSESSMENT  AND PLAN / ED COURSE  Pertinent labs & imaging results that were available during my care of the patient were reviewed by me and considered in my medical decision making (see chart for details).  Sinus congestion. Minor head injury secondary to fall. Patient given discharge care instructions. Patient advised to continue previous medication to take Sudafed twice a day for 3-5 days. Patient advised follow-up family doctor if no improvement in 5 days return by ER for condition worsens      ____________________________________________   FINAL CLINICAL IMPRESSION(S) / ED DIAGNOSES  Final diagnoses:  Minor head injury, initial encounter  Sinus congestion      NEW MEDICATIONS STARTED DURING THIS VISIT:  New Prescriptions   PSEUDOEPHEDRINE (SUDAFED) 120 MG 12 HR TABLET    Take 1 tablet (120 mg total) by mouth 2 (two) times daily as needed for congestion.     Note:  This document was prepared using Dragon voice recognition software and may include unintentional dictation errors.    Sable Feil, PA-C 08/18/16 [place], PA-C 08/18/16 [place], MD 08/19/16 [phone]

## 2016-08-18 NOTE — Discharge Instructions (Signed)
Advised Sudafed for sinus congestion.

## 2017-05-15 ENCOUNTER — Other Ambulatory Visit: Payer: Self-pay | Admitting: Family Medicine

## 2017-05-15 DIAGNOSIS — Z1231 Encounter for screening mammogram for malignant neoplasm of breast: Secondary | ICD-10-CM

## 2017-12-06 ENCOUNTER — Ambulatory Visit
Admission: RE | Admit: 2017-12-06 | Discharge: 2017-12-06 | Disposition: A | Discharge status: Home / Self Care | Payer: Medicare Other | Source: Ambulatory Visit | Attending: Family Medicine | Admitting: Family Medicine

## 2017-12-06 DIAGNOSIS — Z1231 Encounter for screening mammogram for malignant neoplasm of breast: Secondary | ICD-10-CM | POA: Diagnosis present

## 2018-01-25 ENCOUNTER — Encounter: Payer: Self-pay | Admitting: Obstetrics and Gynecology

## 2018-01-25 ENCOUNTER — Ambulatory Visit (INDEPENDENT_AMBULATORY_CARE_PROVIDER_SITE_OTHER): Payer: Medicare Other | Admitting: Obstetrics and Gynecology

## 2018-01-25 ENCOUNTER — Other Ambulatory Visit (HOSPITAL_COMMUNITY)
Admission: RE | Admit: 2018-01-25 | Discharge: 2018-01-25 | Disposition: A | Discharge status: Home / Self Care | Payer: Medicare Other | Source: Ambulatory Visit | Attending: Obstetrics and Gynecology | Admitting: Obstetrics and Gynecology

## 2018-01-25 VITALS — BP 100/64 | HR 62

## 2018-01-25 DIAGNOSIS — R87618 Other abnormal cytological findings on specimens from cervix uteri: Secondary | ICD-10-CM

## 2018-01-25 NOTE — Progress Notes (Signed)
Referring Provider:  [facility], Rutherford Guys, MD  HPI:  Jill Santiago is a 47 y.o.  No obstetric history on file.  who presents today for evaluation and management of abnormal pap smear No menses in 7 months. Has infrequent menses.   Dysplasia History:  NILM, HPV negative, endometrial cells noted on pap smear    OB History  No data available    Past Medical History:  Diagnosis Date  . Chronic low back pain   . COPD (chronic obstructive pulmonary disease) ([place])   . Ectopic pregnancy   . FH: kidney cancer   . Hemorrhoids   . Iron deficiency anemia   . Sacroiliac joint dysfunction   . Seizures ([place])   . Throat cancer ([place])   . Urinary incontinence     Past Surgical History:  Procedure Laterality Date  . BACK SURGERY    . ECTOPIC PREGNANCY SURGERY    . KNEE SURGERY      SOCIAL HISTORY:  Social History   Substance and Sexual Activity  Alcohol Use No  . Alcohol/week: 0.0 standard drinks    Social History   Substance and Sexual Activity  Drug Use No     Family History  Problem Relation Age of Onset  . Breast cancer Neg Hx     ALLERGIES:  Azithromycin and Contrast media [iodinated diagnostic agents]  Current Outpatient Medications on File Prior to Visit  Medication Sig Dispense Refill  . acetaminophen (TYLENOL) 325 MG tablet Take 2 tablets (650 mg total) by mouth every 6 (six) hours as needed for mild pain, moderate pain, fever or headache (or Fever >/= 101).    Marland Kitchen albuterol (PROVENTIL HFA;VENTOLIN HFA) 108 (90 BASE) MCG/ACT inhaler Inhale 2 puffs into the lungs every 6 (six) hours as needed for wheezing or shortness of breath.    Marland Kitchen albuterol (PROVENTIL) (2.5 MG/3ML) 0.083% nebulizer solution Take 2.5 mg by nebulization every 6 (six) hours as needed for wheezing or shortness of breath.    . ARIPiprazole (ABILIFY) 5 MG tablet     . baclofen (LIORESAL) 10 MG tablet Take by mouth.    . bisacodyl (DULCOLAX) 10 MG suppository Place 1  suppository (10 mg total) rectally daily as needed for moderate constipation.    . botulinum toxin Type A (BOTOX) 100 units SOLR injection Inject into the muscle.    . clonazePAM (KLONOPIN) 0.5 MG tablet Take by mouth.    . ferrous sulfate 325 (65 FE) MG tablet Take 325 mg by mouth 3 (three) times daily.    . Fluticasone-Salmeterol (ADVAIR) 250-50 MCG/DOSE AEPB Inhale 1 puff into the lungs 2 (two) times daily.    Marland Kitchen gabapentin (NEURONTIN) 600 MG tablet Take 900-1,200 mg by mouth 3 (three) times daily. Pt takes one and one-half tablet in the morning and at noon and two tablets at bedtime.    Marland Kitchen glycopyrrolate (ROBINUL) 1 MG tablet Take 1 mg by mouth every evening.    . lamoTRIgine (LAMICTAL) 150 MG tablet Take by mouth.    . montelukast (SINGULAIR) 10 MG tablet Take 10 mg by mouth at bedtime.    Marland Kitchen morphine (MS CONTIN) 15 MG 12 hr tablet Take 1 tablet (15 mg total) by mouth every 8 (eight) hours. 15 tablet 0  . Multiple Vitamin (MULTIVITAMIN WITH MINERALS) TABS tablet Take 1 tablet by mouth daily.    . nortriptyline (PAMELOR) 75 MG capsule Take 75 mg by mouth at bedtime.    . Nutritional Supplements (FEEDING SUPPLEMENT,  OSMOLITE 1.5 CAL,) LIQD 2 Cans by G-tube route daily. Bolus feeding by syringes.  Please provide necessary supplies for bolus feeding.    . ondansetron (ZOFRAN) 4 MG tablet Take 4 mg by mouth every 8 (eight) hours as needed for nausea or vomiting.    Marland Kitchen oxyCODONE (OXY IR/ROXICODONE) 5 MG immediate release tablet Take 1 tablet (5 mg total) by mouth 3 (three) times daily as needed for severe pain. 15 tablet 0  . polyethylene glycol (MIRALAX / GLYCOLAX) packet Take 17 g by mouth 2 (two) times daily.    . sertraline (ZOLOFT) 50 MG tablet Take 1 tablet (50 mg total) by mouth daily.    Marland Kitchen tiotropium (SPIRIVA) 18 MCG inhalation capsule Place 18 mcg into inhaler and inhale daily.    . Vitamin D, Ergocalciferol, (DRISDOL) 50000 UNITS CAPS capsule Take 50,000 Units by mouth every 7 (seven) days.      . cyclobenzaprine (FLEXERIL) 10 MG tablet Take 10 mg by mouth 3 (three) times daily.    . prazosin (MINIPRESS) 2 MG capsule Take 1 capsule (2 mg total) by mouth at bedtime. (Patient not taking: Reported on 01/25/2018)    . senna (SENOKOT) 8.6 MG TABS tablet Take 2 tablets (17.2 mg total) by mouth 2 (two) times daily. (Patient not taking: Reported on 01/25/2018)     No current facility-administered medications on file prior to visit.     Physical Exam: -Vitals:  BP 100/64 (BP Location: Left Arm, Patient Position: Sitting, Cuff Size: Normal)   Pulse 62   SpO2 97%  GEN: WD, WN, NAD.  A+ O x 3, good mood and affect. ABD:  NT, ND.  Soft, no masses.  No hernias noted.   Pelvic:   Vulva: Normal appearance.  No lesions.  Vagina: No lesions or abnormalities noted.  Support: Normal pelvic support.  Urethra No masses tenderness or scarring.  Meatus Normal size without lesions or prolapse.  Cervix: See below.  Anus: Normal exam.  No lesions.  Perineum: Normal exam.  No lesions.        Bimanual   Uterus: Normal size.  Non-tender.  Mobile.  AV.  Adnexae: No masses.  Non-tender to palpation.  Cul-de-sac: Negative for abnormality.   PROCEDURE: 1.  Urine Pregnancy Test:  negative 2.  Colposcopy performed with 4% acetic acid after verbal consent obtained                                         -Aceto-white Lesions Location(s):  None noted              -Biopsy performed at 6 o'clock (random)              -ECC indicated and performed: Yes.       -Biopsy sites made hemostatic with pressure and Monsel's solution   -Satisfactory colposcopy: No.    -Evidence of Invasive cervical CA :  NO  Endometrial Biopsy After discussion with the patient regarding her abnormal uterine bleeding I recommended that she proceed with an endometrial biopsy for further diagnosis. The risks, benefits, alternatives, and indications for an endometrial biopsy were discussed with the patient in detail. She understood the  risks including infection, bleeding, cervical laceration and uterine perforation.  Verbal consent was obtained.   PROCEDURE NOTE:  [place] endometrial biopsy was performed using aseptic technique with iodine preparation.  The uterus was sounded to a length  of 6.5 cm.  Adequate sampling was obtained with minimal blood loss.  The patient tolerated the procedure well.  Disposition will be pending pathology.    ASSESSMENT:  Jill Santiago is a 47 y.o. No obstetric history on file. here for  1. Unexplained endometrial cells on cervical Pap smear   2. Other abnormal cytological finding of specimen from cervix   .  PLAN: I discussed the grading system of pap smears and HPV high risk viral types.  We also discussed the potential significance of endometrial cells on pap smear. We will discuss and base management after colpo results return.     Prentice Docker, MD  [facility], [facility] 01/25/2018  1:28 PM   CC: Rutherford Guys, MD Center, [facility] [place], [place] [address]

## 2018-02-05 ENCOUNTER — Telehealth: Payer: Self-pay | Admitting: Obstetrics and Gynecology

## 2018-02-05 NOTE — Telephone Encounter (Signed)
Discussed normal results with patient. Recommend routine follow up with her provider. All questions answered.

## 2019-01-20 ENCOUNTER — Other Ambulatory Visit: Payer: Self-pay | Admitting: Internal Medicine

## 2019-01-20 ENCOUNTER — Other Ambulatory Visit: Payer: Self-pay | Admitting: Family Medicine

## 2019-01-20 DIAGNOSIS — Z1231 Encounter for screening mammogram for malignant neoplasm of breast: Secondary | ICD-10-CM

## 2019-01-24 ENCOUNTER — Other Ambulatory Visit: Payer: Self-pay

## 2019-01-24 ENCOUNTER — Ambulatory Visit
Admission: RE | Admit: 2019-01-24 | Discharge: 2019-01-24 | Disposition: A | Discharge status: Home / Self Care | Payer: Medicare Other | Source: Ambulatory Visit | Attending: Family Medicine | Admitting: Family Medicine

## 2019-01-24 DIAGNOSIS — Z1231 Encounter for screening mammogram for malignant neoplasm of breast: Secondary | ICD-10-CM

## 2020-04-05 ENCOUNTER — Other Ambulatory Visit: Payer: Self-pay | Admitting: Family Medicine

## 2020-05-24 ENCOUNTER — Other Ambulatory Visit: Payer: Self-pay | Admitting: Family Medicine

## 2020-05-24 DIAGNOSIS — Z1231 Encounter for screening mammogram for malignant neoplasm of breast: Secondary | ICD-10-CM

## 2020-09-06 ENCOUNTER — Ambulatory Visit
Admission: RE | Admit: 2020-09-06 | Discharge: 2020-09-06 | Disposition: A | Discharge status: Home / Self Care | Payer: Medicare Other | Source: Ambulatory Visit | Attending: Family Medicine | Admitting: Family Medicine

## 2020-09-06 ENCOUNTER — Other Ambulatory Visit: Payer: Self-pay

## 2020-09-06 DIAGNOSIS — Z1231 Encounter for screening mammogram for malignant neoplasm of breast: Secondary | ICD-10-CM

## 2021-08-09 ENCOUNTER — Other Ambulatory Visit: Payer: Self-pay | Admitting: Family Medicine

## 2021-08-09 DIAGNOSIS — Z1231 Encounter for screening mammogram for malignant neoplasm of breast: Secondary | ICD-10-CM

## 2021-10-18 ENCOUNTER — Ambulatory Visit
Admission: RE | Admit: 2021-10-18 | Discharge: 2021-10-18 | Disposition: A | Discharge status: Home / Self Care | Payer: Medicare Other | Source: Ambulatory Visit | Attending: Family Medicine | Admitting: Family Medicine

## 2021-10-18 DIAGNOSIS — Z1231 Encounter for screening mammogram for malignant neoplasm of breast: Secondary | ICD-10-CM | POA: Insufficient documentation

## 2022-05-19 ENCOUNTER — Encounter: Payer: Self-pay | Admitting: Family Medicine

## 2022-09-28 IMAGING — MG MM DIGITAL SCREENING BILAT W/ TOMO AND CAD
8 series · 9 of 24 positions shown · non-contrast
Comparison: Previous exam(s).

CLINICAL DATA: Screening.

EXAM:
DIGITAL SCREENING BILATERAL MAMMOGRAM WITH TOMOSYNTHESIS AND CAD
TECHNIQUE: Bilateral screening digital craniocaudal and mediolateral oblique
mammograms were obtained. Bilateral screening digital breast
tomosynthesis was performed. The images were evaluated with
computer-aided detection.

[L CC synth-2D]
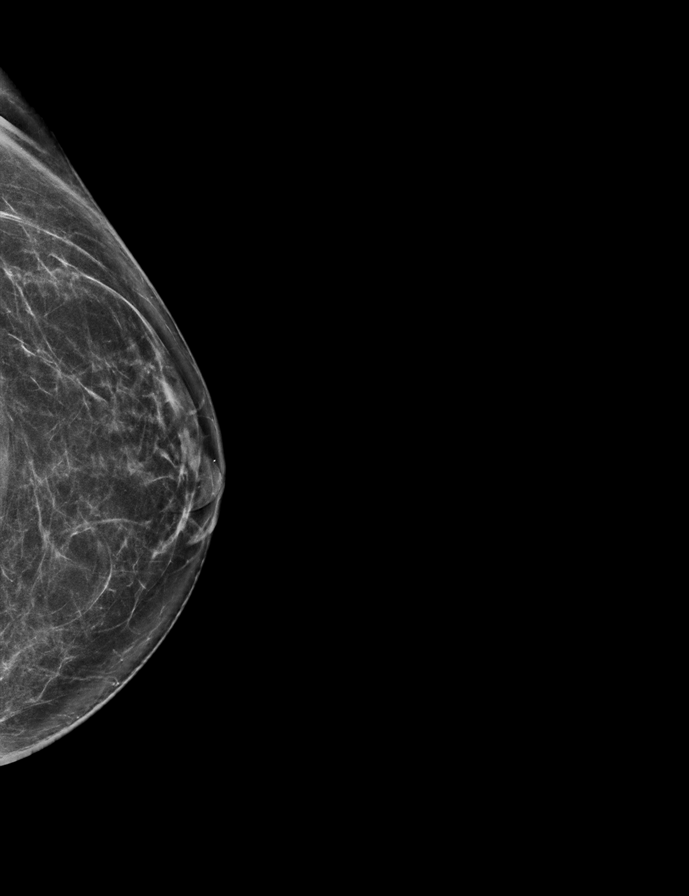

[R CC synth-2D]
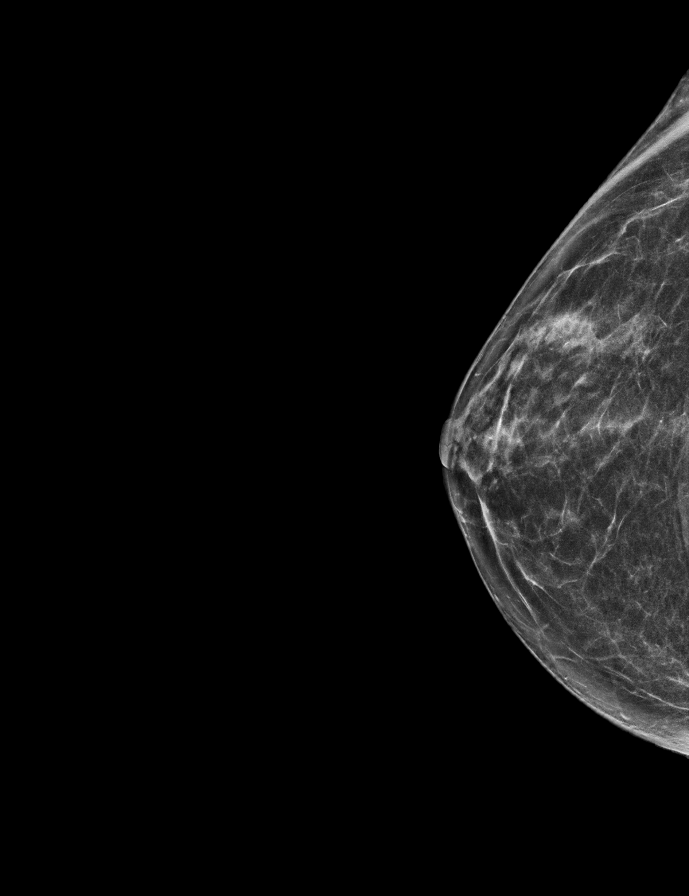

[L MLO synth-2D]
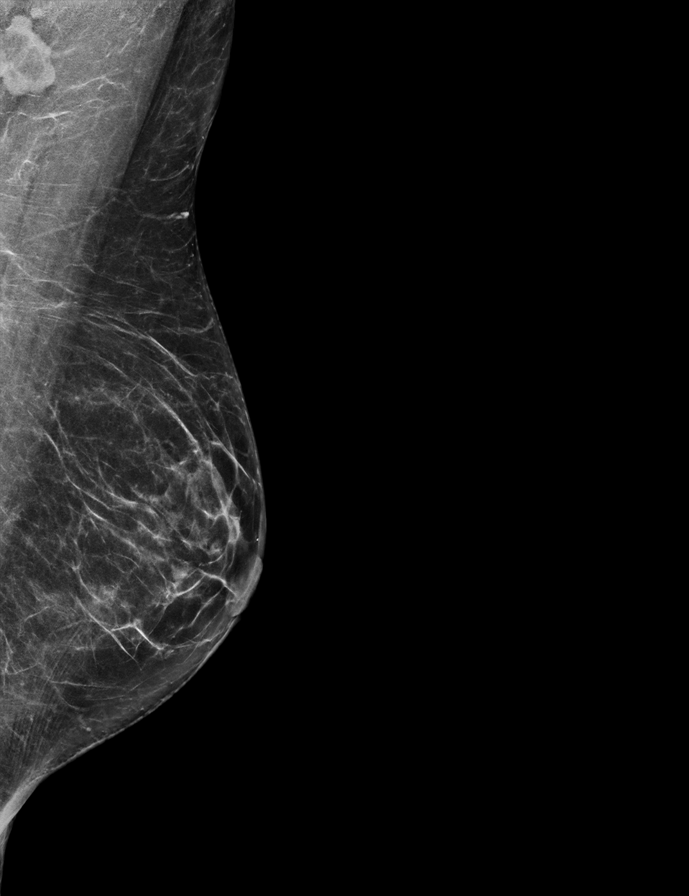

[R MLO synth-2D]
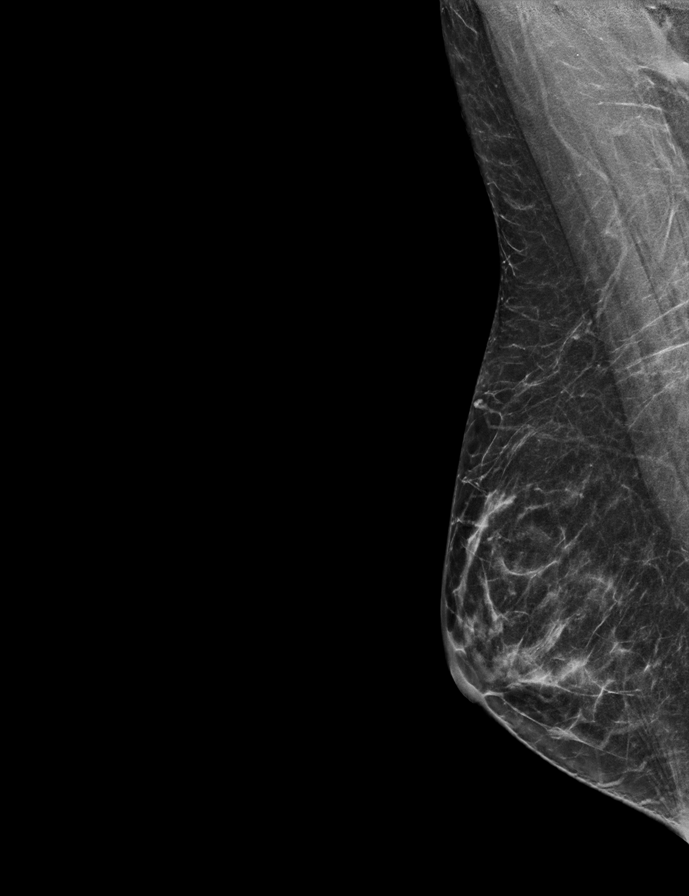

[R CC tomo · 2 of 58 frames shown]
[frame 19/58]
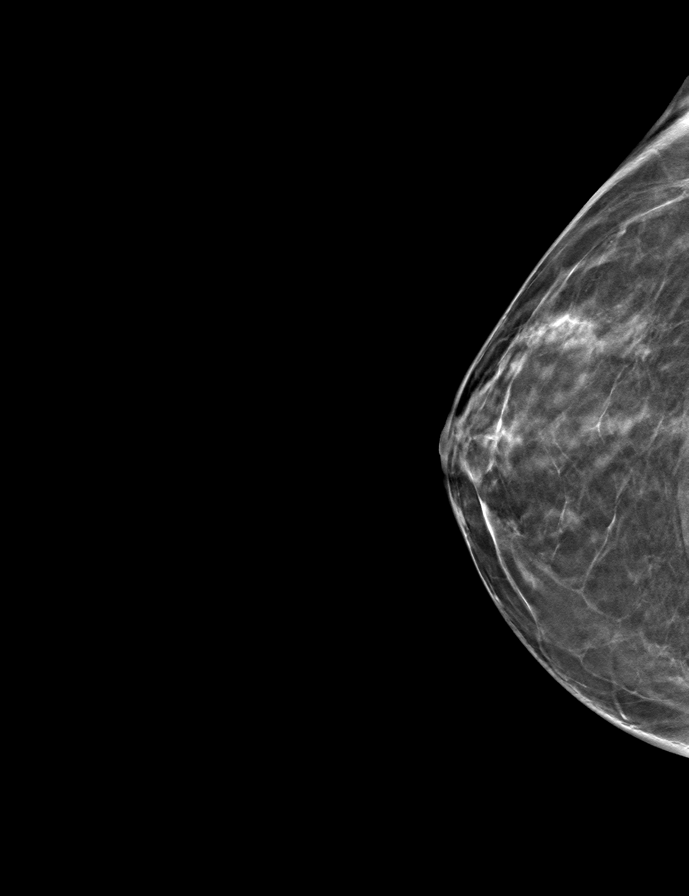
[frame 29/58]
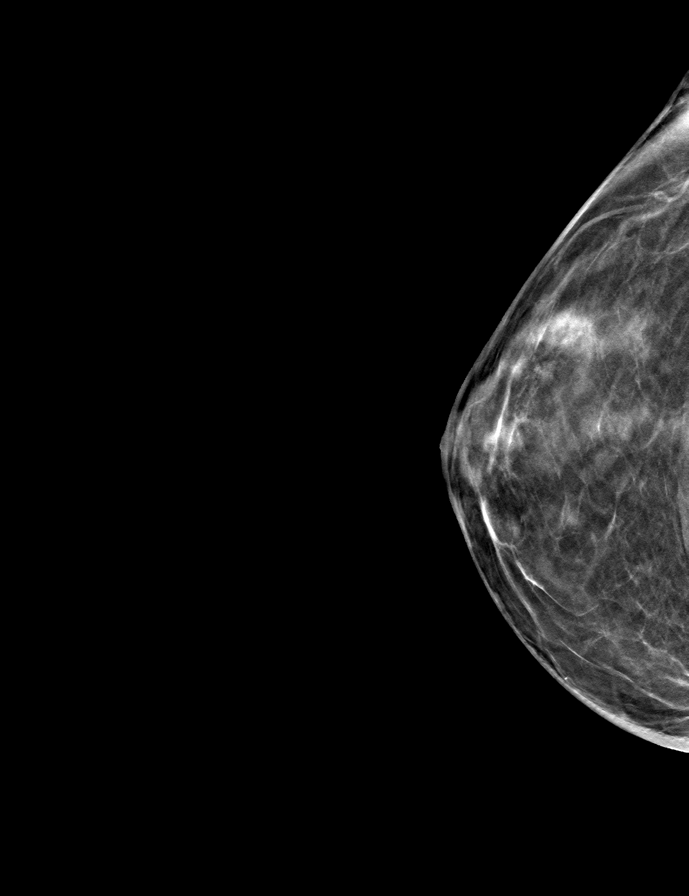

[L CC tomo · tomo slice 33/66.0]
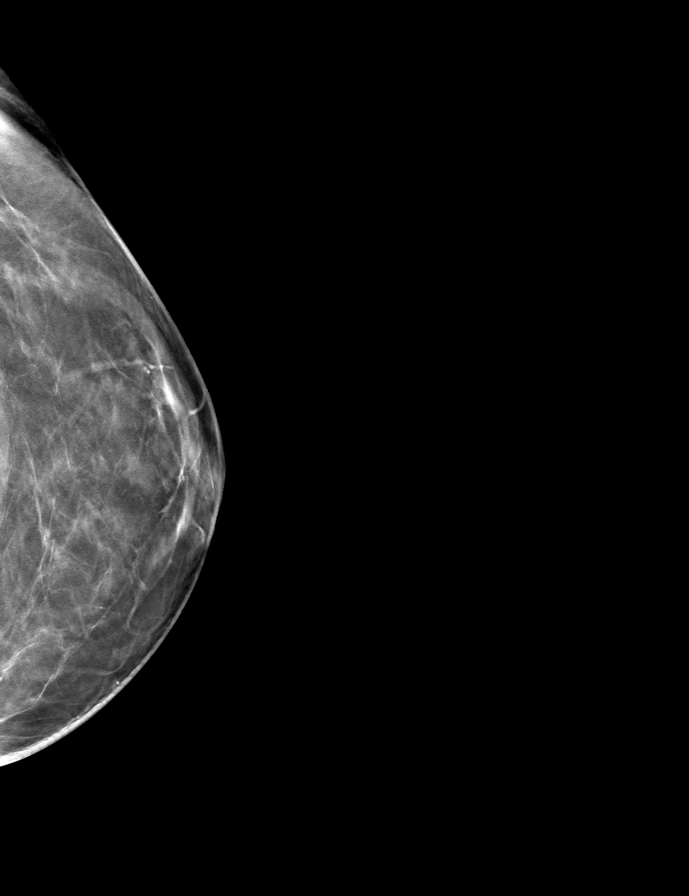

[L MLO tomo · tomo slice 33/66.0]
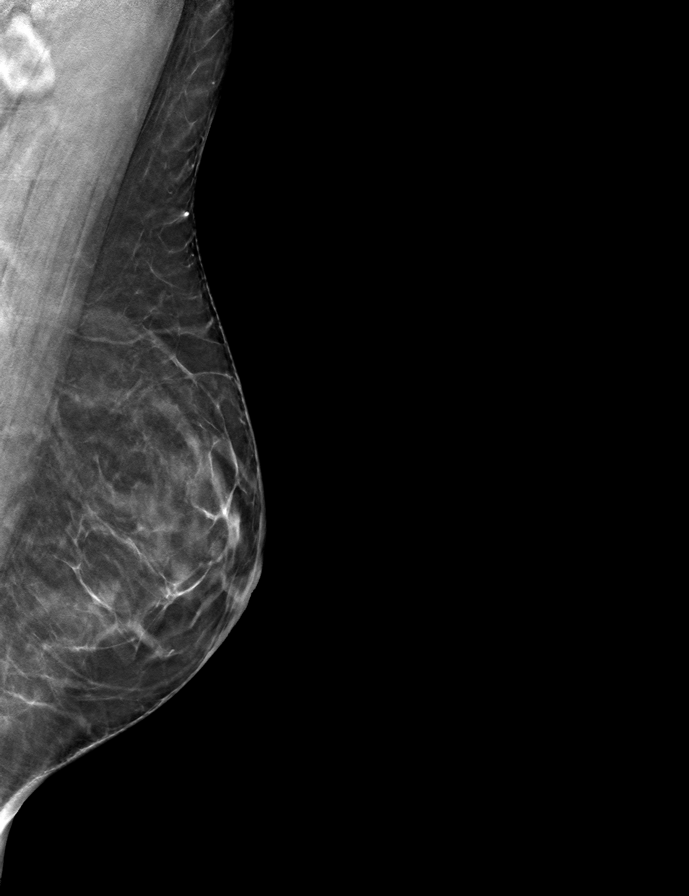

[R MLO tomo · tomo slice 30/59.0]
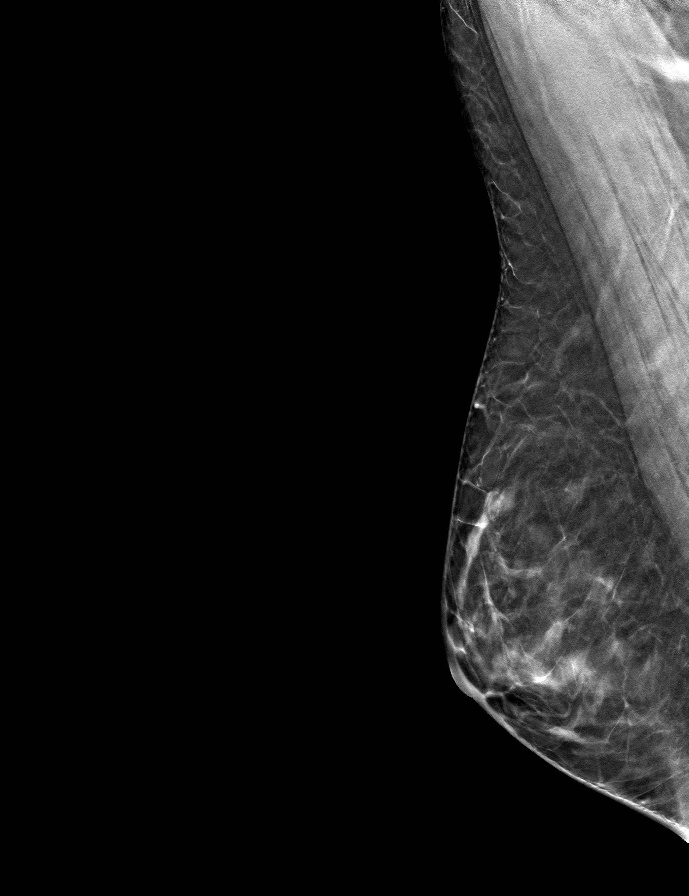

[9 of 24 positions shown; findings below may reference images not displayed]

ACR Breast Density Category b: There are scattered areas of
fibroglandular density.
FINDINGS: There are no findings suspicious for malignancy.
IMPRESSION: No mammographic evidence of malignancy. A result letter of this
screening mammogram will be mailed directly to the patient.

RECOMMENDATION:
Screening mammogram in one year. (Code:51-O-LD2)

BI-RADS CATEGORY  1: Negative.
